# Patient Record
Sex: Female | Born: 1955 | Race: Black or African American | Hispanic: No | Marital: Single | Smoking: Former smoker
Health system: Southern US, Community
[De-identification: ages and names within clinical notes are randomized; demographics above are authoritative.]

## PROBLEM LIST (undated history)

## (undated) DIAGNOSIS — Z1509 Genetic susceptibility to other malignant neoplasm: Secondary | ICD-10-CM

## (undated) DIAGNOSIS — I10 Essential (primary) hypertension: Secondary | ICD-10-CM

## (undated) DIAGNOSIS — D352 Benign neoplasm of pituitary gland: Secondary | ICD-10-CM

## (undated) DIAGNOSIS — C539 Malignant neoplasm of cervix uteri, unspecified: Secondary | ICD-10-CM

## (undated) DIAGNOSIS — N809 Endometriosis, unspecified: Secondary | ICD-10-CM

## (undated) DIAGNOSIS — Z1501 Genetic susceptibility to malignant neoplasm of breast: Secondary | ICD-10-CM

## (undated) HISTORY — DX: Malignant neoplasm of cervix uteri, unspecified: C53.9

## (undated) HISTORY — PX: ANKLE SURGERY: SHX546

## (undated) HISTORY — DX: Genetic susceptibility to malignant neoplasm of breast: Z15.09

## (undated) HISTORY — PX: LIPOMA EXCISION: SHX5283

## (undated) HISTORY — DX: Endometriosis, unspecified: N80.9

## (undated) HISTORY — DX: Genetic susceptibility to malignant neoplasm of breast: Z15.01

## (undated) HISTORY — DX: Essential (primary) hypertension: I10

## (undated) HISTORY — DX: Benign neoplasm of pituitary gland: D35.2

## (undated) HISTORY — PX: LEG SURGERY: SHX1003

---

## 1995-11-23 HISTORY — PX: VAGINAL HYSTERECTOMY: SHX2639

## 1998-07-25 ENCOUNTER — Other Ambulatory Visit: Admission: RE | Admit: 1998-07-25 | Discharge: 1998-07-25 | Payer: Self-pay | Admitting: *Deleted

## 2002-01-25 ENCOUNTER — Encounter: Admission: RE | Admit: 2002-01-25 | Discharge: 2002-01-25 | Payer: Self-pay | Admitting: *Deleted

## 2002-01-25 ENCOUNTER — Other Ambulatory Visit: Admission: RE | Admit: 2002-01-25 | Discharge: 2002-01-25 | Payer: Self-pay | Admitting: *Deleted

## 2003-07-30 ENCOUNTER — Encounter: Admission: RE | Admit: 2003-07-30 | Discharge: 2003-07-30 | Payer: Self-pay | Admitting: *Deleted

## 2003-07-30 ENCOUNTER — Other Ambulatory Visit: Admission: RE | Admit: 2003-07-30 | Discharge: 2003-07-30 | Payer: Self-pay | Admitting: *Deleted

## 2004-09-18 ENCOUNTER — Encounter: Admission: RE | Admit: 2004-09-18 | Discharge: 2004-09-18 | Payer: Self-pay | Admitting: *Deleted

## 2008-11-22 HISTORY — PX: BREAST BIOPSY: SHX20

## 2009-08-15 ENCOUNTER — Encounter (INDEPENDENT_AMBULATORY_CARE_PROVIDER_SITE_OTHER): Payer: Self-pay | Admitting: Obstetrics and Gynecology

## 2009-08-15 ENCOUNTER — Encounter: Admission: RE | Admit: 2009-08-15 | Discharge: 2009-08-15 | Payer: Self-pay | Admitting: Obstetrics and Gynecology

## 2012-05-30 ENCOUNTER — Other Ambulatory Visit: Payer: Self-pay | Admitting: Obstetrics and Gynecology

## 2012-05-30 DIAGNOSIS — Z1231 Encounter for screening mammogram for malignant neoplasm of breast: Secondary | ICD-10-CM

## 2012-06-22 ENCOUNTER — Ambulatory Visit
Admission: RE | Admit: 2012-06-22 | Discharge: 2012-06-22 | Disposition: A | Discharge status: Home / Self Care | Payer: Commercial Indemnity | Source: Ambulatory Visit | Attending: Obstetrics and Gynecology | Admitting: Obstetrics and Gynecology

## 2012-06-22 DIAGNOSIS — Z1231 Encounter for screening mammogram for malignant neoplasm of breast: Secondary | ICD-10-CM

## 2013-06-12 ENCOUNTER — Other Ambulatory Visit: Payer: Self-pay

## 2013-06-12 DIAGNOSIS — Z1231 Encounter for screening mammogram for malignant neoplasm of breast: Secondary | ICD-10-CM

## 2013-07-16 ENCOUNTER — Ambulatory Visit
Admission: RE | Admit: 2013-07-16 | Discharge: 2013-07-16 | Disposition: A | Discharge status: Home / Self Care | Payer: Commercial Indemnity | Source: Ambulatory Visit

## 2013-07-16 DIAGNOSIS — Z1231 Encounter for screening mammogram for malignant neoplasm of breast: Secondary | ICD-10-CM

## 2013-07-30 ENCOUNTER — Other Ambulatory Visit: Payer: Self-pay | Admitting: Obstetrics and Gynecology

## 2013-07-30 ENCOUNTER — Other Ambulatory Visit: Payer: Self-pay | Admitting: Obstetrics

## 2013-07-30 DIAGNOSIS — R928 Other abnormal and inconclusive findings on diagnostic imaging of breast: Secondary | ICD-10-CM

## 2013-08-13 ENCOUNTER — Ambulatory Visit
Admission: RE | Admit: 2013-08-13 | Discharge: 2013-08-13 | Disposition: A | Discharge status: Home / Self Care | Payer: Commercial Indemnity | Source: Ambulatory Visit | Attending: Obstetrics and Gynecology | Admitting: Obstetrics and Gynecology

## 2013-08-13 DIAGNOSIS — R928 Other abnormal and inconclusive findings on diagnostic imaging of breast: Secondary | ICD-10-CM

## 2014-07-16 ENCOUNTER — Other Ambulatory Visit: Payer: Self-pay

## 2014-07-16 DIAGNOSIS — Z1231 Encounter for screening mammogram for malignant neoplasm of breast: Secondary | ICD-10-CM

## 2014-07-19 ENCOUNTER — Ambulatory Visit
Admission: RE | Admit: 2014-07-19 | Discharge: 2014-07-19 | Disposition: A | Discharge status: Home / Self Care | Payer: BC Managed Care – PPO | Source: Ambulatory Visit

## 2014-07-19 DIAGNOSIS — Z1231 Encounter for screening mammogram for malignant neoplasm of breast: Secondary | ICD-10-CM

## 2021-08-10 ENCOUNTER — Other Ambulatory Visit: Payer: Self-pay | Admitting: Obstetrics

## 2021-08-10 DIAGNOSIS — Z1231 Encounter for screening mammogram for malignant neoplasm of breast: Secondary | ICD-10-CM

## 2021-09-07 ENCOUNTER — Ambulatory Visit: Payer: Self-pay

## 2021-09-16 ENCOUNTER — Other Ambulatory Visit: Payer: Self-pay

## 2021-09-16 ENCOUNTER — Ambulatory Visit
Admission: RE | Admit: 2021-09-16 | Discharge: 2021-09-16 | Disposition: A | Discharge status: Home / Self Care | Payer: Managed Care, Other (non HMO) | Source: Ambulatory Visit | Attending: Obstetrics | Admitting: Obstetrics

## 2021-09-16 DIAGNOSIS — Z1231 Encounter for screening mammogram for malignant neoplasm of breast: Secondary | ICD-10-CM

## 2021-10-19 ENCOUNTER — Other Ambulatory Visit: Payer: Self-pay | Admitting: Obstetrics

## 2021-10-19 DIAGNOSIS — Z1501 Genetic susceptibility to malignant neoplasm of breast: Secondary | ICD-10-CM

## 2021-12-10 ENCOUNTER — Telehealth: Payer: Self-pay | Admitting: Hematology and Oncology

## 2021-12-10 NOTE — Telephone Encounter (Signed)
Scheduled appt per 1/19 referral. Spoke to pt who is aware of appt date and time. Pt had requested to schedule at the end of March. Will mail calendar and letter to pt per request.

## 2021-12-11 ENCOUNTER — Other Ambulatory Visit: Payer: Self-pay | Admitting: Obstetrics

## 2021-12-11 DIAGNOSIS — Z1501 Genetic susceptibility to malignant neoplasm of breast: Secondary | ICD-10-CM

## 2021-12-11 DIAGNOSIS — Z1509 Genetic susceptibility to other malignant neoplasm: Secondary | ICD-10-CM

## 2021-12-14 ENCOUNTER — Telehealth: Payer: Self-pay | Admitting: *Deleted

## 2021-12-14 NOTE — Telephone Encounter (Signed)
Attempted to reach the patient on 1/20 at 12:30 pm and LMOM to call the office back to a new patient appt with Dr Berline Lopes.  Attempted to reacht he patent today to schedule a new patient appt with Dr Berline Lopes [facility] to call the office back

## 2021-12-16 NOTE — Telephone Encounter (Signed)
Attempted to reacht he patent today to schedule a new patient appt with Dr Berline Lopes [facility] to call the office back

## 2021-12-23 ENCOUNTER — Telehealth: Payer: Self-pay | Admitting: *Deleted

## 2021-12-23 NOTE — Telephone Encounter (Signed)
LMOM for patient to call the office back   Called and spoke with Freda Munro at Dr [facility] office and explained that our office has reached out to the patient times 3 with no response

## 2021-12-24 ENCOUNTER — Telehealth: Payer: Self-pay | Admitting: *Deleted

## 2021-12-24 NOTE — Telephone Encounter (Signed)
Kathy Christensen from [facility] office call and stated that the patient does want to see Dr Berline Lopes. Patient requesting appt on 3/31. Called the patient and LMOM to call the office back

## 2021-12-24 NOTE — Telephone Encounter (Signed)
Patient called back ans was scheduled for a new patient appt for 3/31 at 9:45 am with Dr Berline Lopes per patient request. Patient given address and phone number for the clinic; along with the policy for mask and visitors

## 2022-02-18 ENCOUNTER — Encounter: Payer: Self-pay | Admitting: Gynecologic Oncology

## 2022-02-18 NOTE — Progress Notes (Signed)
GYNECOLOGIC ONCOLOGY NEW PATIENT CONSULTATION  ? ?Patient Name: Kathy Christensen  ?Patient Age: 66 y.o. ?Date of Service: 02/19/22 ?Referring Provider: [name], MD ?[address],  [place] [address]  ? ?Primary Care Provider: [name], MD ?Consulting Provider: Jeral Pinch, MD  ? ?Assessment/Plan:  ?Postmenopausal patient with pathogenic BRCA2 mutation. ? ?The risk of ovarian cancer in patients with BRCA 2 mutations is approximately 10-15% to the age of 1.  In addition, patients with BRCA mutations are also at increased risk of breast cancer, and an increased (but still low) risk of pancreatic cancer and melanoma.  Unlike their BRCA 1 mutation counterparts, patients with a BRCA2 mutation are not at increased risk of uterine serous carcinoma. ? ?The [employer]  (NCCN) recommends removal of bilateral ovaries and fallopian tubes between the ages of 60-40, and/or when childbearing is completed, to reduce the risk of ovarian and fallopian tube cancer.  If a patient does not undergo risk-reducing surgery, it is recommended they have CA-125 serum levels and pelvic ultrasounds every 6 months as a surveillance approach, although these are not particularly sensitive methods of screening. ? ?Preventive surgery to remove the ovaries and fallopian tubes reduces the risk of a related cancer by 80% in women who carry a BRCA1 or BRCA2 mutation.  Women who undergo preventive surgery retain approximately a 4% risk of developing cancer of the peritoneum.  ? ?We discussed her particular case in detail.  Given her age as well as the fact that I presume her fallopian tubes removed at the time of her hysterectomy for cervical cancer, I think her risk of developing ovarian cancer is likely lower than the numbers that I would "someone typically with a BRCA2 mutation.  The patient is going to look over her medical records to see if any of them state whether she had bilateral salpingectomy  at the time of her hysterectomy.   ? ?While she has had major abdominal surgery, this was performed laparoscopically and vaginally.  She did not require any adjuvant treatment with radiation.  While she may have some adhesive disease, this hopefully means that she is not at significant risk of having developed intra-abdominal and pelvic adhesions.  With her ovaries have not been pexed out of her pelvis, I think that pelvic exam and pelvic ultrasound have limited utility in being able to see and assess her ovaries.  If we were to follow her with imaging, it would likely need to be abdominal imaging with either CT scan or MRI.  She is very aware of the fact that we do not have good screening tools for ovarian cancer.   ? ?After discussion, the patient voiced that she would like to move forward with scheduling risk-reducing BSO. ? ?The patient lives alone and does not have much social support.  She lives more than 3 hours away.  She would plan to stay in town for several days at a hotel before driving home.   ? ?We discussed the plan for a robotic assisted bilateral salpingo-oophorectomy, possible staging, possible laparotomy.  I stressed that there is a low but present risk of finding cancer at the time of surgery for risk-reducing surgery in the setting of a BRCA mutation.  If that were to be the case, then I would proceed with additional staging procedures as indicated such as lymph node biopsy, peritoneal biopsy, and omentectomy.  The risks of surgery were discussed in detail and she understands these to include infection; wound separation; hernia;  injury to adjacent organs such as bowel, bladder, blood vessels, ureters and nerves; bleeding which may require blood transfusion; anesthesia risk; thromboembolic events; possible death; unforeseen complications; possible need for re-exploration; medical complications such as heart attack, stroke, pleural effusion and pneumonia; and, if full lymphadenectomy is performed  the risk of lymphedema and lymphocyst. The patient will receive DVT and antibiotic prophylaxis as indicated. She voiced a clear understanding. She had the opportunity to ask questions. Perioperative instructions were reviewed with her. Prescriptions for post-op medications were sent to her pharmacy of choice. ? ?A copy of this note was sent to the patient's referring provider.  ? ?70 minutes of total time was spent for this patient encounter, including preparation, face-to-face counseling with the patient and coordination of care, and documentation of the encounter. ? ? ?Jeral Pinch, MD  ?Division of Gynecologic Oncology  ?Department of Obstetrics and Gynecology  ?[facility]  ?___________________________________________  ?Chief Complaint: ?Chief Complaint  ?Patient presents with  ? Genetic susceptibility to malignant neoplasm of breast  ? ? ?History of Present Illness:  ?Kathy Christensen is a 66 y.o. y.o. female who is seen in consultation at the request of [name], MD for an evaluation of risk reducing strategies in the setting of a known BRCA2 mutation. ? ?Patient underwent genetic testing with [facility] in 09/2021.  Family history is notable for ovarian cancer in her mother and multiple maternal relatives with history of breast cancer.  Testing revealed a pathogenic mutation in BRCA2, [id number].  She was also found to have a VUS in [place], [id number]. Pelvic ultrasound exam performed at [facility] on 12/09/2021 was unable to evaluate bilateral adnexa due to shadowing from bowel. ? ?Patient reports doing well.  She denies any pelvic or abdominal pain.  She endorses normal bowel and bladder function.  She denies any postmenopausal bleeding or discharge.  She endorses a good appetite without nausea or emesis. ? 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  In discussing this with the patient today.  She  has surgery with an oncologist in [facility] that she describes as a vaginal hysterectomy with laparoscopic lymph node evaluation.  It sounds like she had a radical hysterectomy as she required a Foley catheter postoperatively.  She is unsure about her fallopian tubes, but was told that her ovaries were left in situ at the time of surgery and pexed out of her pelvis.  This was given concerned that she may need radiation although she did not require any adjuvant treatment.  During the time that she was treated for and followed after for her cervix cancer, she saw various oncologists and GYNs in [place], at [place] and women's in [place], in [place], and ultimately someone here in [place].  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 ? ?Patient is meeting with Dr. Chryl Heck today given her BRCA2 mutation. ? ?PAST MEDICAL HISTORY:  ?Past Medical History:  ?Diagnosis Date  ? BRCA2 gene mutation positive in female   ? Cervix cancer ([place])   ? Hypertension   ? Pituitary macroadenoma ([place])   ?  ? ?PAST SURGICAL HISTORY:  ?Past Surgical History:  ?Procedure Laterality Date  ? ANKLE SURGERY    ? BREAST BIOPSY Left 2010  ? LEG SURGERY Right   ? plate and pins  ? LIPOMA EXCISION    ? back of shoulder  ? VAGINAL HYSTERECTOMY  1997  ?  lsc LND for early cervix cancer  ? ? ?OB/GYN HISTORY:  ?OB History  ?Gravida Para Term Preterm AB Living  ?2 0       0  ?SAB IAB Ectopic Multiple Live Births  ?           ?  ?# Outcome Date GA Lbr Len/2nd Weight Sex Delivery Anes PTL Lv  ?2 Gravida           ?1 Gravida           ? ? ?No LMP recorded. Patient has had a hysterectomy. ? ?Age at menarche: 76 ?Age at menopause: Menopausal symptoms began in her 43s ?Hx of HRT: Denies ?Hx of STDs: Denies ?Last pap: 2022 ?History of abnormal pap smears: Yes, history of early stage cervix cancer in her 14s, normal Pap smears since ? ?SCREENING STUDIES:  ?Last mammogram: 08/2021, scheduled for MR today  ?Last colonoscopy: 11/2008 ?Last bone  mineral density: 2010 ? ?MEDICATIONS: ?Outpatient Encounter Medications as of 02/19/2022  ?Medication Sig  ? hydrochlorothiazide (HYDRODIURIL) 12.5 MG tablet Take 12.5 mg by mouth daily.  ? ?No facility-adm

## 2022-02-19 ENCOUNTER — Inpatient Hospital Stay (HOSPITAL_BASED_OUTPATIENT_CLINIC_OR_DEPARTMENT_OTHER): Payer: Managed Care, Other (non HMO) | Admitting: Gynecologic Oncology

## 2022-02-19 ENCOUNTER — Encounter: Payer: Self-pay | Admitting: Gynecologic Oncology

## 2022-02-19 ENCOUNTER — Encounter: Payer: Self-pay | Admitting: Hematology and Oncology

## 2022-02-19 ENCOUNTER — Inpatient Hospital Stay: Payer: Managed Care, Other (non HMO) | Attending: Hematology and Oncology | Admitting: Hematology and Oncology

## 2022-02-19 ENCOUNTER — Telehealth: Payer: Self-pay | Admitting: *Deleted

## 2022-02-19 ENCOUNTER — Ambulatory Visit
Admission: RE | Admit: 2022-02-19 | Discharge: 2022-02-19 | Disposition: A | Discharge status: Home / Self Care | Payer: Managed Care, Other (non HMO) | Source: Ambulatory Visit | Attending: Obstetrics | Admitting: Obstetrics

## 2022-02-19 ENCOUNTER — Other Ambulatory Visit: Payer: Self-pay

## 2022-02-19 ENCOUNTER — Inpatient Hospital Stay: Payer: Managed Care, Other (non HMO)

## 2022-02-19 VITALS — BP 119/67 | HR 64 | Temp 98.2°F | Resp 16 | Ht 64.0 in | Wt 244.5 lb

## 2022-02-19 DIAGNOSIS — Z8541 Personal history of malignant neoplasm of cervix uteri: Secondary | ICD-10-CM | POA: Diagnosis not present

## 2022-02-19 DIAGNOSIS — Z90710 Acquired absence of both cervix and uterus: Secondary | ICD-10-CM | POA: Diagnosis not present

## 2022-02-19 DIAGNOSIS — Z8041 Family history of malignant neoplasm of ovary: Secondary | ICD-10-CM | POA: Diagnosis not present

## 2022-02-19 DIAGNOSIS — Z1501 Genetic susceptibility to malignant neoplasm of breast: Secondary | ICD-10-CM

## 2022-02-19 DIAGNOSIS — Z148 Genetic carrier of other disease: Secondary | ICD-10-CM | POA: Diagnosis present

## 2022-02-19 DIAGNOSIS — Z803 Family history of malignant neoplasm of breast: Secondary | ICD-10-CM

## 2022-02-19 DIAGNOSIS — Z79899 Other long term (current) drug therapy: Secondary | ICD-10-CM | POA: Insufficient documentation

## 2022-02-19 DIAGNOSIS — Z1502 Genetic susceptibility to malignant neoplasm of ovary: Secondary | ICD-10-CM | POA: Diagnosis not present

## 2022-02-19 DIAGNOSIS — I10 Essential (primary) hypertension: Secondary | ICD-10-CM | POA: Insufficient documentation

## 2022-02-19 DIAGNOSIS — Z86018 Personal history of other benign neoplasm: Secondary | ICD-10-CM | POA: Diagnosis not present

## 2022-02-19 DIAGNOSIS — Z6841 Body Mass Index (BMI) 40.0 and over, adult: Secondary | ICD-10-CM | POA: Insufficient documentation

## 2022-02-19 MED ORDER — GADOBUTROL 1 MMOL/ML IV SOLN
10.0000 mL | Freq: Once | INTRAVENOUS | Status: AC | PRN
  Administered 2022-02-19: 10 mL via INTRAVENOUS

## 2022-02-19 NOTE — Telephone Encounter (Signed)
Spoke with pt this afternoon regarding staying overnight in the hospital after surgery to decrease her stress and maximize healing. Pt agree to plan. Providers informed. Pt verbalized understanding and did not have any other concerns at this time.  ?

## 2022-02-19 NOTE — Progress Notes (Signed)
Patient here for new patient consultation with Dr. Jeral Pinch and for a pre-operative discussion prior to her scheduled surgery on Apr 13, 2022. She is scheduled for robotic assisted laparoscopic bilateral salpingo-oophorectomy, possible staging if a cancer is found, possible laparotomy. The surgery was discussed in detail.  See after visit summary for additional details. Visual aids used to discuss items related to surgery including the sequential compression stockings, foley catheter, IV pump, multi-modal pain regimen including tylenol, photo of the surgical robot, female reproductive system to discuss surgery in detail.    ?  ?Discussed post-op pain management in detail including the aspects of the enhanced recovery pathway. We discussed the use of tylenol post-op and to monitor for a maximum of 4,000 mg in a 24 hour period. Discussed bowel regimen in detail.   ?  ?Discussed the use of SCDs, and measures to take at home to prevent DVT including frequent mobility.  Reportable signs and symptoms of DVT discussed. Post-operative instructions discussed and expectations for after surgery. Incisional care discussed as well including reportable signs and symptoms including erythema, drainage, wound separation.  ?   ?5 minutes spent with the patient.  Verbalizing understanding of material discussed. No needs or concerns voiced at the end of the visit.   Advised patient to call for any needs.  ? ?This appointment is included in the global surgical bundle as pre-operative teaching and has no charge.     ?

## 2022-02-19 NOTE — Progress Notes (Signed)
[facility] ?CONSULT NOTE ? ?Patient Care Team: ?[name], MD as PCP - General (Obstetrics and Gynecology) ? ?CHIEF COMPLAINTS/PURPOSE OF CONSULTATION:  ?BRCA 2 pathogenic mutation ? ?ASSESSMENT & PLAN:  ? ?This is a very pleasant 66 year old female patient with BRCA2 gene mutation, family history significant for breast and ovarian cancer followed by gynecology now referred to high risk breast clinic as well as gynecological oncology. ?We have discussed the following findings about BRCA2 gene mutation ? ?BRCA2 gene mutation: ?I discussed with the patient that BRCA2 is a tumor suppressor gene which helps repair damaged DNA or destroy cells if they cannot be repaired. In patients with BRCA1 or 2 mutations, the damaged DNA could not be repaired properly increasing the risk of cancers. BRCA2 gene is located on long arm of chromosome 13. These mutations are inherited in autosomal dominant fashion and hence 50% probability that their children may have a BRCA mutation. ? ?Cancer risk:  ?Average to risk of cancer by age of 62: (Antoniou et al pooled pedigree data from 47 studies of 8139 index patients with breast or ovarian cancer) ?Breast cancer risk: 45 percent (95% CI, 33 to 54 percent) ?Ovarian cancer risk: 11 percent (95% CI, 4.1 to 18 percent) ? ?[place] study: Tumor limited to risk by age of 50: ?Breast cancer risk: 55 percent (95% CI, 41 to 70 percent) ?Ovarian cancer risk: 16.5 percent (95% CI, 7.5 to 34 percent) ? ?I discussed the difference between BRCA1 and BRCA2 wherein patients BRCA1 patients have early onset disease and much higher risk of breast cancer. The mean age of diagnosis of breast cancer between BRCA1 and 2 are 38 versus 62 years, risk of ovarian cancer is also higher with BRCA1 but the overall risk under the age of 14 is very low. ? ?Other cancer risks:  ?1. Pancreatic cancer (relative risk is 3.51) with incidence of 4.9% in BRCA2 carriers ?2. Fallopian tube carcinoma and primary  peritoneal carcinoma ?3. Uterine papillary serous carcinoma: Overall risk is very low ?4. Colorectal cancers: In many studies there was no increased risk But there may be some for BRCA1 carriers ?5. Melanoma and other skin cancers: The risk of oatmeal melanoma is increase in BRCA2 mutation carriers but it still extremely rare. ?6. Endometrial cancer: Not very clear in terms of risks it is thought to be very low ? ?Breast cancer risk reduction/surveillance: ?1. Annual mammogram and breast MRI are recommended by NCCN starting at age of 11-30 ?2. Chemoprevention with tamoxifen-like agents is not entirely clear. Based on NSABP P1 study in the small cohort of patients with BRCA1 mutations, tamoxifen to reduce breast cancer risk by 62% and BRCA2 carriers but not in BRCA1 carriers. The study is limited because of small numbers.  ?3. After childbearing, evaluation for prophylactic bilateral mastectomy is an option. ?4. Oophorectomy self reduces the risk of breast cancer by 50% ?5. Breast self-examinations starting at age 77  ? ?Ovarian cancer risk reduction: ?1. Risk reducing bilateral salpingo-oophorectomy between the ages of 79-40 once childbearing is complete is recommended. In one study that was 72% reduction of risk of ovarian cancer and a decrease in breast cancer by approximately 50% ?2. There is no clear data for surveillance with CA125 or vaginal ultrasounds ?2. Oral contraception dose may be protective against ovarian cancer but it may slightly increase risk of breast cancer. ? ?Patient is not interested in proceeding with bilateral mastectomy.  She tells me that she has no support and no one that can  help her or assist her after surgery hence although she understands the risks, she is willing to do intensified screening but cannot proceed with surgery. ? ?She is agreeable to being followed up in the high-risk breast clinic every 6 months.  She has had MRI breast scheduled for later today.  She will be proceeding  with BSO in May by Dr. Berline Lopes. ? ?HISTORY OF PRESENTING ILLNESS:  ?Kathy Christensen 66 y.o. female is here because of BRCA 2 gene mutation. ? ?This is a 66 year old female patient with BRCA2 gene mutation referred to high risk breast clinic for additional recommendations.  She arrived to the appointment by herself.  She lives near [place].  She has already seen Dr. Berline Lopes and will be scheduled for bilateral salpingo-oophorectomy on May 23.  She is not ready to proceed with bilateral mastectomy.  She has her MRI of the breast scheduled for today.  She denies any breast changes.  She is nulliparous, states that she does not have the support system to recover from a bilateral mastectomy. ?She otherwise is healthy, works for a [employer]ch[employer], retail.  Rest of the pertinent 10 point ROS reviewed and negative. ? ?REVIEW OF SYSTEMS:   ?Constitutional: Denies fevers, chills or abnormal night sweats ?Eyes: Denies blurriness of vision, double vision or watery eyes ?Ears, nose, mouth, throat, and face: Denies mucositis or sore throat ?Respiratory: Denies cough, dyspnea or wheezes ?Cardiovascular: Denies palpitation, chest discomfort or lower extremity swelling ?Gastrointestinal:  Denies nausea, heartburn or change in bowel habits ?Skin: Denies abnormal skin rashes ?Lymphatics: Denies new lymphadenopathy or easy bruising ?Neurological:Denies numbness, tingling or new weaknesses ?Behavioral/Psych: Mood is stable, no new changes  ?All other systems were reviewed with the patient and are negative. ? ?MEDICAL HISTORY:  ?Past Medical History:  ?Diagnosis Date  ? BRCA2 gene mutation positive in female   ? Cervix cancer ([place])   ? Hypertension   ? Pituitary macroadenoma ([place])   ? ? ?SURGICAL HISTORY: ?Past Surgical History:  ?Procedure Laterality Date  ? ANKLE SURGERY    ? BREAST BIOPSY Left 2010  ? LEG SURGERY Right   ? plate and pins  ? LIPOMA EXCISION    ? back of shoulder  ? VAGINAL HYSTERECTOMY  1997  ? lsc LND for early cervix cancer  ? ? ?SOCIAL  HISTORY: ?Social History  ? ?Socioeconomic History  ? Marital status: Single  ?  Spouse name: Not on file  ? Number of children: Not on file  ? Years of education: Not on file  ? Highest education level: Not on file  ?Occupational History  ? Occupation: works for [employer]  ?Tobacco Use  ? Smoking status: Former  ?  Types: Cigarettes  ?  Quit date: 71  ?  Years since quitting: 30.2  ? Smokeless tobacco: Never  ?Substance and Sexual Activity  ? Alcohol use: Yes  ?  Alcohol/week: 3.0 standard drinks  ?  Types: 3 Standard drinks or equivalent per week  ? Drug use: Not Currently  ? Sexual activity: Not Currently  ?Other Topics Concern  ? Not on file  ?Social History Narrative  ? Not on file  ? ?Social Determinants of Health  ? ?Financial Resource Strain: Not on file  ?Food Insecurity: Not on file  ?Transportation Needs: Not on file  ?Physical Activity: Not on file  ?Stress: Not on file  ?Social Connections: Not on file  ?Intimate Partner Violence: Not on file  ? ? ?FAMILY HISTORY: ?Family History  ?Problem Relation Age of  Onset  ? Ovarian cancer Mother   ? Breast cancer Maternal Aunt   ? Breast cancer Maternal Grandmother   ? Prostate cancer Paternal Grandfather   ? ? ?ALLERGIES:  is allergic to penicillin g. ? ?MEDICATIONS:  ?Current Outpatient Medications  ?Medication Sig Dispense Refill  ? hydrochlorothiazide (HYDRODIURIL) 12.5 MG tablet Take 12.5 mg by mouth daily.    ? ?No current facility-administered medications for this visit.  ? ? ? ?PHYSICAL EXAMINATION: ?ECOG PERFORMANCE STATUS: 0 - Asymptomatic ? ?Vitals:  ? 02/19/22 1049  ?BP: 119/67  ?Temp: 98.2 ?F (36.8 ?C)  ?SpO2: 100%  ? ?Filed Weights  ? 02/19/22 1049  ?Weight: 244 lb (110.7 kg)  ? ? ?GENERAL:alert, no distress and comfortable ?SKIN: skin color, texture, turgor are normal, no rashes or significant lesions ?EYES: normal, conjunctiva are pink and non-injected, sclera clear ?OROPHARYNX:no exudate, no erythema and lips, buccal mucosa, and tongue normal   ?NECK: supple, thyroid normal size, non-tender, without nodularity ?LYMPH:  no palpable lymphadenopathy in the cervical, axillary or inguinal ?Breasts: Bilateral breast examined.  No palpable masses.  Some ab

## 2022-02-19 NOTE — Patient Instructions (Signed)
Surrounding hotel recommendations include [employer], [employer], [employer], [place], [place].  ? ?Preparing for your Surgery ? ?Plan for surgery on Apr 13, 2022 with Dr. Jeral Pinch at [place] will be scheduled for robotic assisted laparoscopic bilateral salpingo-oophorectomy (removal of the ovaries and fallopian tubes), possible staging if a cancer is found, possible laparotomy (larger incision on your abdomen if needed).  ? ?Pre-operative Testing ?-You will receive a phone call from presurgical testing at [facility] to arrange for a pre-operative appointment and lab work. ? ?-Bring your insurance card, copy of an advanced directive if applicable, medication list ? ?-At that visit, you will be asked to sign a consent for a possible blood transfusion in case a transfusion becomes necessary during surgery.  The need for a blood transfusion is rare but having consent is a necessary part of your care.    ? ?-You should not be taking blood thinners or aspirin at least ten days prior to surgery unless instructed by your surgeon. ? ?-Do not take supplements such as fish oil (omega 3), red yeast rice, turmeric before your surgery. You want to avoid medications with aspirin in them including headache powders such as BC or Goody's), Excedrin migraine. ? ?Day Before Surgery at Home ?-You will be asked to take in a light diet the day before surgery. You will be advised you can have clear liquids up until 3 hours before your surgery.   ? ?Eat a light diet the day before surgery.  Examples including soups, broths, toast, yogurt, mashed potatoes.  AVOID GAS PRODUCING FOODS. Things to avoid include carbonated beverages (fizzy beverages, sodas), raw fruits and raw vegetables (uncooked), or beans.  ? ?If your bowels are filled with gas, your surgeon will have difficulty visualizing your pelvic organs which increases your surgical risks. ? ?Your role in  recovery ?Your role is to become active as soon as directed by your doctor, while still giving yourself time to heal.  Rest when you feel tired. You will be asked to do the following in order to speed your recovery: ? ?- Cough and breathe deeply. This helps to clear and expand your lungs and can prevent pneumonia after surgery.  ?- STAY ACTIVE WHEN YOU GET HOME. Do mild physical activity. Walking or moving your legs help your circulation and body functions return to normal. Do not try to get up or walk alone the first time after surgery.   ?-If you develop swelling on one leg or the other, pain in the back of your leg, redness/warmth in one of your legs, please call the office or go to the Emergency Room to have a doppler to rule out a blood clot. For shortness of breath, chest pain-seek care in the Emergency Room as soon as possible. ?- Actively manage your pain. Managing your pain lets you move in comfort. We will ask you to rate your pain on a scale of zero to 10. It is your responsibility to tell your doctor or nurse where and how much you hurt so your pain can be treated. ? ?Special Considerations ?-If you are diabetic, you may be placed on insulin after surgery to have closer control over your blood sugars to promote healing and recovery.  This does not mean that you will be discharged on insulin.  If applicable, your oral antidiabetics will be resumed when you are tolerating a solid diet. ? ?-Your final pathology results from surgery should be available  around one week after surgery and the results will be relayed to you when available. ? ?-Dr. Lahoma Crocker is the surgeon that assists your GYN Oncologist with surgery.  If you end up staying the night, the next day after your surgery you will either see Dr. Berline Lopes or Dr. Lahoma Crocker. ? ?-FMLA forms can be faxed to [phone] and please allow 5-7 business days for completion. ? ?Pain Management After Surgery ?-You will be prescribed your pain  medication and bowel regimen medications before surgery so that you can have these available when you are discharged from the hospital. The pain medication is for use ONLY AFTER surgery and a new prescription will not be given.  ? ?-Make sure that you have Tylenol and Ibuprofen at home to use on a regular basis after surgery for pain control. We recommend alternating the medications every hour to six hours since they work differently and are processed in the body differently for pain relief. ? ?-Review the attached handout on narcotic use and their risks and side effects.  ? ?Bowel Regimen ?-You will be prescribed Sennakot-S to take nightly to prevent constipation especially if you are taking the narcotic pain medication intermittently.  It is important to prevent constipation and drink adequate amounts of liquids. You can stop taking this medication when you are not taking pain medication and you are back on your normal bowel routine. ? ?Risks of Surgery ?Risks of surgery are low but include bleeding, infection, damage to surrounding structures, re-operation, blood clots, and very rarely death. ? ? ?Blood Transfusion Information (For the consent to be signed before surgery) ? ?We will be checking your blood type before surgery so in case of emergencies, we will know what type of blood you would need. ? ?                                          WHAT IS A BLOOD TRANSFUSION? ? ?A transfusion is the replacement of blood or some of its parts. Blood is made up of multiple cells which provide different functions. ?Red blood cells carry oxygen and are used for blood loss replacement. ?White blood cells fight against infection. ?Platelets control bleeding. ?Plasma helps clot blood. ?Other blood products are available for specialized needs, such as hemophilia or other clotting disorders. ?BEFORE THE TRANSFUSION  ?Who gives blood for transfusions?  ?You may be able to donate blood to be used at a later date on yourself  (autologous donation). ?Relatives can be asked to donate blood. This is generally not any safer than if you have received blood from a stranger. The same precautions are taken to ensure safety when a relative's blood is donated. ?Healthy volunteers who are fully evaluated to make sure their blood is safe. This is blood bank blood. ?Transfusion therapy is the safest it has ever been in the practice of medicine. Before blood is taken from a donor, a complete history is taken to make sure that person has no history of diseases nor engages in risky social behavior (examples are intravenous drug use or sexual activity with multiple partners). The donor's travel history is screened to minimize risk of transmitting infections, such as malaria. The donated blood is tested for signs of infectious diseases, such as HIV and hepatitis. The blood is then tested to be sure it is compatible with you in order to minimize the chance of  a transfusion reaction. If you or a relative donates blood, this is often done in anticipation of surgery and is not appropriate for emergency situations. It takes many days to process the donated blood. ?RISKS AND COMPLICATIONS ?Although transfusion therapy is very safe and saves many lives, the main dangers of transfusion include:  ?Getting an infectious disease. ?Developing a transfusion reaction. This is an allergic reaction to something in the blood you were given. Every precaution is taken to prevent this. ?The decision to have a blood transfusion has been considered carefully by your caregiver before blood is given. Blood is not given unless the benefits outweigh the risks. ? ?AFTER SURGERY INSTRUCTIONS ? ?Return to work: 4-6 weeks if applicable ? ?Activity: ?1. Be up and out of the bed during the day.  Take a nap if needed.  You may walk up steps but be careful and use the hand rail.  Stair climbing will tire you more than you think, you may need to stop part way and rest.  ? ?2. No lifting or  straining for 6 weeks over 10 pounds. No pushing, pulling, straining for 6 weeks. ? ?3. No driving for around 1 week(s).  Do not drive if you are taking narcotic pain medicine and make sure that your reacti

## 2022-02-19 NOTE — Patient Instructions (Signed)
Preparing for your Surgery ?  ?Plan for surgery on Apr 13, 2022 with Dr. Jeral Pinch at [place] will be scheduled for robotic assisted laparoscopic bilateral salpingo-oophorectomy (removal of the ovaries and fallopian tubes), possible staging if a cancer is found, possible laparotomy (larger incision on your abdomen if needed).  ?  ?Pre-operative Testing ?-You will receive a phone call from presurgical testing at [facility] to arrange for a pre-operative appointment and lab work. ?  ?-Bring your insurance card, copy of an advanced directive if applicable, medication list ?  ?-At that visit, you will be asked to sign a consent for a possible blood transfusion in case a transfusion becomes necessary during surgery.  The need for a blood transfusion is rare but having consent is a necessary part of your care.    ?  ?-You should not be taking blood thinners or aspirin at least ten days prior to surgery unless instructed by your surgeon. ?  ?-Do not take supplements such as fish oil (omega 3), red yeast rice, turmeric before your surgery. You want to avoid medications with aspirin in them including headache powders such as BC or Goody's), Excedrin migraine. ?  ?Day Before Surgery at Home ?-You will be asked to take in a light diet the day before surgery. You will be advised you can have clear liquids up until 3 hours before your surgery.   ?  ?Eat a light diet the day before surgery.  Examples including soups, broths, toast, yogurt, mashed potatoes.  AVOID GAS PRODUCING FOODS. Things to avoid include carbonated beverages (fizzy beverages, sodas), raw fruits and raw vegetables (uncooked), or beans.  ?  ?If your bowels are filled with gas, your surgeon will have difficulty visualizing your pelvic organs which increases your surgical risks. ?  ?Your role in recovery ?Your role is to become active as soon as directed by your doctor, while still giving yourself time to heal.  Rest when you  feel tired. You will be asked to do the following in order to speed your recovery: ?  ?- Cough and breathe deeply. This helps to clear and expand your lungs and can prevent pneumonia after surgery.  ?- STAY ACTIVE WHEN YOU GET HOME. Do mild physical activity. Walking or moving your legs help your circulation and body functions return to normal. Do not try to get up or walk alone the first time after surgery.   ?-If you develop swelling on one leg or the other, pain in the back of your leg, redness/warmth in one of your legs, please call the office or go to the Emergency Room to have a doppler to rule out a blood clot. For shortness of breath, chest pain-seek care in the Emergency Room as soon as possible. ?- Actively manage your pain. Managing your pain lets you move in comfort. We will ask you to rate your pain on a scale of zero to 10. It is your responsibility to tell your doctor or nurse where and how much you hurt so your pain can be treated. ?  ?Special Considerations ?-If you are diabetic, you may be placed on insulin after surgery to have closer control over your blood sugars to promote healing and recovery.  This does not mean that you will be discharged on insulin.  If applicable, your oral antidiabetics will be resumed when you are tolerating a solid diet. ?  ?-Your final pathology results from surgery should be available around one week after surgery  and the results will be relayed to you when available. ?  ?-Dr. Lahoma Crocker is the surgeon that assists your GYN Oncologist with surgery.  If you end up staying the night, the next day after your surgery you will either see Dr. Berline Lopes or Dr. Lahoma Crocker. ?  ?-FMLA forms can be faxed to [phone] and please allow 5-7 business days for completion. ?  ?Pain Management After Surgery ?-You will be prescribed your pain medication and bowel regimen medications before surgery so that you can have these available when you are discharged from the  hospital. The pain medication is for use ONLY AFTER surgery and a new prescription will not be given.  ?  ?-Make sure that you have Tylenol and Ibuprofen at home to use on a regular basis after surgery for pain control. We recommend alternating the medications every hour to six hours since they work differently and are processed in the body differently for pain relief. ?  ?-Review the attached handout on narcotic use and their risks and side effects.  ?  ?Bowel Regimen ?-You will be prescribed Sennakot-S to take nightly to prevent constipation especially if you are taking the narcotic pain medication intermittently.  It is important to prevent constipation and drink adequate amounts of liquids. You can stop taking this medication when you are not taking pain medication and you are back on your normal bowel routine. ?  ?Risks of Surgery ?Risks of surgery are low but include bleeding, infection, damage to surrounding structures, re-operation, blood clots, and very rarely death. ?  ?  ?Blood Transfusion Information (For the consent to be signed before surgery) ?  ?We will be checking your blood type before surgery so in case of emergencies, we will know what type of blood you would need. ?  ?                                          WHAT IS A BLOOD TRANSFUSION? ?  ?A transfusion is the replacement of blood or some of its parts. Blood is made up of multiple cells which provide different functions. ?Red blood cells carry oxygen and are used for blood loss replacement. ?White blood cells fight against infection. ?Platelets control bleeding. ?Plasma helps clot blood. ?Other blood products are available for specialized needs, such as hemophilia or other clotting disorders. ?BEFORE THE TRANSFUSION  ?Who gives blood for transfusions?  ?You may be able to donate blood to be used at a later date on yourself (autologous donation). ?Relatives can be asked to donate blood. This is generally not any safer than if you have received  blood from a stranger. The same precautions are taken to ensure safety when a relative's blood is donated. ?Healthy volunteers who are fully evaluated to make sure their blood is safe. This is blood bank blood. ?Transfusion therapy is the safest it has ever been in the practice of medicine. Before blood is taken from a donor, a complete history is taken to make sure that person has no history of diseases nor engages in risky social behavior (examples are intravenous drug use or sexual activity with multiple partners). The donor's travel history is screened to minimize risk of transmitting infections, such as malaria. The donated blood is tested for signs of infectious diseases, such as HIV and hepatitis. The blood is then tested to be sure it is compatible with you  in order to minimize the chance of a transfusion reaction. If you or a relative donates blood, this is often done in anticipation of surgery and is not appropriate for emergency situations. It takes many days to process the donated blood. ?RISKS AND COMPLICATIONS ?Although transfusion therapy is very safe and saves many lives, the main dangers of transfusion include:  ?Getting an infectious disease. ?Developing a transfusion reaction. This is an allergic reaction to something in the blood you were given. Every precaution is taken to prevent this. ?The decision to have a blood transfusion has been considered carefully by your caregiver before blood is given. Blood is not given unless the benefits outweigh the risks. ?  ?AFTER SURGERY INSTRUCTIONS ?  ?Return to work: 4-6 weeks if applicable ?  ?Activity: ?1. Be up and out of the bed during the day.  Take a nap if needed.  You may walk up steps but be careful and use the hand rail.  Stair climbing will tire you more than you think, you may need to stop part way and rest.  ?  ?2. No lifting or straining for 6 weeks over 10 pounds. No pushing, pulling, straining for 6 weeks. ?  ?3. No driving for around 1  week(s).  Do not drive if you are taking narcotic pain medicine and make sure that your reaction time has returned.  ?  ?4. You can shower as soon as the next day after surgery. Shower daily.  Use your regular s

## 2022-02-23 ENCOUNTER — Other Ambulatory Visit: Payer: Self-pay | Admitting: Obstetrics

## 2022-02-23 DIAGNOSIS — R9389 Abnormal findings on diagnostic imaging of other specified body structures: Secondary | ICD-10-CM

## 2022-03-12 ENCOUNTER — Telehealth: Payer: Self-pay

## 2022-03-12 NOTE — Telephone Encounter (Signed)
Spoke with Kathy Christensen this morning and offered her an earlier surgical date of 03/17/22. Patient would prefer to keep her surgery as scheduled on 04/13/22 as she has already made accommodations around it.  ?

## 2022-04-01 NOTE — Patient Instructions (Addendum)
DUE TO COVID-19 ONLY TWO VISITORS  (aged 66 and older)  ARE ALLOWED TO COME WITH YOU AND STAY IN THE WAITING ROOM ONLY DURING PRE OP AND PROCEDURE.   ?**NO VISITORS ARE ALLOWED IN THE SHORT STAY AREA OR RECOVERY ROOM!!** ? ?IF YOU WILL BE ADMITTED INTO THE HOSPITAL YOU ARE ALLOWED ONLY FOUR SUPPORT PEOPLE DURING VISITATION HOURS ONLY (7 AM -8PM)   ?The support person(s) must pass our screening, gel in and out, and wear a mask at all times, including in the patient?s room. ?Patients must also wear a mask when staff or their support person are in the room. ?Visitors GUEST BADGE MUST BE WORN VISIBLY  ?One adult visitor may remain with you overnight and MUST be in the room by 8 P.M. ?  ? ? Your procedure is scheduled on: 04/13/22 ? ? Report to [facility] Main Entrance ? ?  Report to admitting at 5:15 AM ? ? Call this number if you have problems the morning of surgery [phone] ? ? Do not eat food :After Midnight. ? ? After Midnight you may have the following liquids until 4:30 AM DAY OF SURGERY ? ?Water ?Black Coffee (sugar ok, NO MILK/CREAM OR CREAMERS)  ?Tea (sugar ok, NO MILK/CREAM OR CREAMERS) regular and decaf                             ?Plain Jell-O (NO RED)                                           ?Fruit ices (not with fruit pulp, NO RED)                                     ?Popsicles (NO RED)                                                                  ?Juice: apple, WHITE grape, WHITE cranberry ?Sports drinks like Gatorade (NO RED) ?Clear broth(vegetable,chicken,beef) ?  ?The day of surgery:  ?Drink ONE (1) Pre-Surgery Clear Ensure at 4:30 AM the morning of surgery. Drink in one sitting. Do not sip.  ?This drink was given to you during your hospital  ?pre-op appointment visit. ?Nothing else to drink after completing the  ?Pre-Surgery Clear Ensure. ?  ?       If you have questions, please contact your surgeon?s office. ? ? ?FOLLOW BOWEL PREP AND ANY ADDITIONAL PRE OP INSTRUCTIONS YOU RECEIVED  FROM YOUR SURGEON'S OFFICE!!! ?  ?  ?Oral Hygiene is also important to reduce your risk of infection.                                    ?Remember - BRUSH YOUR TEETH THE MORNING OF SURGERY WITH YOUR REGULAR TOOTHPASTE ? ? Take these medicines the morning of surgery with A SIP OF WATER: None ?                  ?  You may not have any metal on your body including hair pins, jewelry, and body piercing ? ?           Do not wear make-up, lotions, powders, perfumes, or deodorant ? ?Do not wear nail polish including gel and S&S, artificial/acrylic nails, or any other type of covering on natural nails including finger and toenails. If you have artificial nails, gel coating, etc. that needs to be removed by a nail salon please have this removed prior to surgery or surgery may need to be canceled/ delayed if the surgeon/ anesthesia feels like they are unable to be safely monitored.  ? ?Do not shave  48 hours prior to surgery.  ? ? Do not bring valuables to the hospital. [facility] NOT ?            RESPONSIBLE   FOR VALUABLES. ? ? Bring small overnight bag day of surgery. ?  ?            Please read over the following fact sheets you were given: IF [place] [phone]- Apolonio Schneiders ? ?   [facility] - Preparing for Surgery ?Before surgery, you can play an important role.  Because skin is not sterile, your skin needs to be as free of germs as possible.  You can reduce the number of germs on your skin by washing with CHG (chlorahexidine gluconate) soap before surgery.  CHG is an antiseptic cleaner which kills germs and bonds with the skin to continue killing germs even after washing. ?Please DO NOT use if you have an allergy to CHG or antibacterial soaps.  If your skin becomes reddened/irritated stop using the CHG and inform your nurse when you arrive at Short Stay. ?Do not shave (including legs and underarms) for at least 48 hours prior to the first CHG shower.  You  may shave your face/neck. ? ?Please follow these instructions carefully: ? 1.  Shower with CHG Soap the night before surgery and the  morning of surgery. ? 2.  If you choose to wash your hair, wash your hair first as usual with your normal  shampoo. ? 3.  After you shampoo, rinse your hair and body thoroughly to remove the shampoo.                            ? 4.  Use CHG as you would any other liquid soap.  You can apply chg directly to the skin and wash.  Gently with a scrungie or clean washcloth. ? 5.  Apply the CHG Soap to your body ONLY FROM THE NECK DOWN.   Do   not use on face/ open      ?                     Wound or open sores. Avoid contact with eyes, ears mouth and   genitals (private parts).  ?                     Production manager,  Genitals (private parts) with your normal soap. ?            6.  Wash thoroughly, paying special attention to the area where your    surgery  will be performed. ? 7.  Thoroughly rinse your body with warm water from the neck down. ? 8.  DO NOT shower/wash with your  normal soap after using and rinsing off the CHG Soap. ?               9.  Pat yourself dry with a clean towel. ?           10.  Wear clean pajamas. ?           11.  Place clean sheets on your bed the night of your first shower and do not  sleep with pets. ?Day of Surgery : ?Do not apply any lotions/deodorants the morning of surgery.  Please wear clean clothes to the hospital/surgery center. ? ?FAILURE TO FOLLOW THESE INSTRUCTIONS MAY RESULT IN THE CANCELLATION OF YOUR SURGERY ? ?PATIENT SIGNATURE_________________________________ ? ?NURSE SIGNATURE__________________________________ ? ?________________________________________________________________________  ? ?Incentive Spirometer ? ?An incentive spirometer is a tool that can help keep your lungs clear and active. This tool measures how well you are filling your lungs with each breath. Taking long deep breaths may help reverse or decrease the chance of developing breathing  (pulmonary) problems (especially infection) following: ?A long period of time when you are unable to move or be active. ?BEFORE THE PROCEDURE  ?If the spirometer includes an indicator to show your best effort, your nurse or respiratory therapist will set it to a desired goal. ?If possible, sit up straight or lean slightly forward. Try not to slouch. ?Hold the incentive spirometer in an upright position. ?INSTRUCTIONS FOR USE  ?Sit on the edge of your bed if possible, or sit up as far as you can in bed or on a chair. ?Hold the incentive spirometer in an upright position. ?Breathe out normally. ?Place the mouthpiece in your mouth and seal your lips tightly around it. ?Breathe in slowly and as deeply as possible, raising the piston or the ball toward the top of the column. ?Hold your breath for 3-5 seconds or for as long as possible. Allow the piston or ball to fall to the bottom of the column. ?Remove the mouthpiece from your mouth and breathe out normally. ?Rest for a few seconds and repeat Steps 1 through 7 at least 10 times every 1-2 hours when you are awake. Take your time and take a few normal breaths between deep breaths. ?The spirometer may include an indicator to show your best effort. Use the indicator as a goal to work toward during each repetition. ?After each set of 10 deep breaths, practice coughing to be sure your lungs are clear. If you have an incision (the cut made at the time of surgery), support your incision when coughing by placing a pillow or rolled up towels firmly against it. ?Once you are able to get out of bed, walk around indoors and cough well. You may stop using the incentive spirometer when instructed by your caregiver.  ?RISKS AND COMPLICATIONS ?Take your time so you do not get dizzy or light-headed. ?If you are in pain, you may need to take or ask for pain medication before doing incentive spirometry. It is harder to take a deep breath if you are having pain. ?AFTER USE ?Rest and  breathe slowly and easily. ?It can be helpful to keep track of a log of your progress. Your caregiver can provide you with a simple table to help with this. ?If you are using the spirometer at home, follow t

## 2022-04-01 NOTE — Progress Notes (Addendum)
COVID Vaccine Completed: yes x3 ? ?Date of COVID positive in last 90 days: no ? ?PCP - [name], MD ?Cardiologist - n/a ? ?Chest x-ray - Sep 22 ?EKG - Sept 22 ?Stress Test - n/a ?ECHO - n/a ?Cardiac Cath - n/a ?Pacemaker/ICD device last checked: n/a ?Spinal Cord Stimulator: n/a ? ?Bowel Prep - light diet the day before surgery ? ?Sleep Study - n/a ?CPAP -  ? ?Fasting Blood Sugar - n/a ?Checks Blood Sugar _____ times a day ? ?Blood Thinner Instructions: n/a ?Aspirin Instructions: ?Last Dose: ? ?Activity level: Can go up a flight of stairs and perform activities of daily living without stopping and without symptoms of chest pain or shortness of breath. ?     ? ?Anesthesia review:  ? ?Patient denies shortness of breath, fever, cough and chest pain at PAT appointment ? ? ?Patient verbalized understanding of instructions that were given to them at the PAT appointment. Patient was also instructed that they will need to review over the PAT instructions again at home before surgery.  ?

## 2022-04-02 ENCOUNTER — Encounter (HOSPITAL_COMMUNITY)
Admission: RE | Admit: 2022-04-02 | Discharge: 2022-04-02 | Disposition: A | Discharge status: Home / Self Care | Payer: Managed Care, Other (non HMO) | Source: Ambulatory Visit | Attending: Gynecologic Oncology | Admitting: Gynecologic Oncology

## 2022-04-02 ENCOUNTER — Encounter (HOSPITAL_COMMUNITY): Payer: Self-pay

## 2022-04-02 VITALS — BP 133/87 | HR 82 | Temp 98.8°F | Resp 17 | Ht 63.0 in | Wt 244.0 lb

## 2022-04-02 DIAGNOSIS — I2510 Atherosclerotic heart disease of native coronary artery without angina pectoris: Secondary | ICD-10-CM

## 2022-04-02 DIAGNOSIS — Z1502 Genetic susceptibility to malignant neoplasm of ovary: Secondary | ICD-10-CM | POA: Insufficient documentation

## 2022-04-02 DIAGNOSIS — Z1501 Genetic susceptibility to malignant neoplasm of breast: Secondary | ICD-10-CM | POA: Diagnosis not present

## 2022-04-02 DIAGNOSIS — Z1509 Genetic susceptibility to other malignant neoplasm: Secondary | ICD-10-CM | POA: Diagnosis not present

## 2022-04-02 DIAGNOSIS — Z01812 Encounter for preprocedural laboratory examination: Secondary | ICD-10-CM | POA: Diagnosis present

## 2022-04-02 LAB — CBC
HCT: 40.7 % (ref 36.0–46.0)
Hemoglobin: 13.2 g/dL (ref 12.0–15.0)
MCH: 28.8 pg (ref 26.0–34.0)
MCHC: 32.4 g/dL (ref 30.0–36.0)
MCV: 88.9 fL (ref 80.0–100.0)
Platelets: 290 10*3/uL (ref 150–400)
RBC: 4.58 MIL/uL (ref 3.87–5.11)
RDW: 15 % (ref 11.5–15.5)
WBC: 7.8 10*3/uL (ref 4.0–10.5)
nRBC: 0 % (ref 0.0–0.2)

## 2022-04-02 LAB — COMPREHENSIVE METABOLIC PANEL
ALT: 15 U/L (ref 0–44)
AST: 16 U/L (ref 15–41)
Albumin: 3.9 g/dL (ref 3.5–5.0)
Alkaline Phosphatase: 63 U/L (ref 38–126)
Anion gap: 9 (ref 5–15)
BUN: 15 mg/dL (ref 8–23)
CO2: 24 mmol/L (ref 22–32)
Calcium: 9.5 mg/dL (ref 8.9–10.3)
Chloride: 107 mmol/L (ref 98–111)
Creatinine, Ser: 0.88 mg/dL (ref 0.44–1.00)
GFR, Estimated: >60 mL/min (ref >60)
Glucose, Bld: 97 mg/dL (ref 70–99)
Potassium: 3.9 mmol/L (ref 3.5–5.1)
Sodium: 140 mmol/L (ref 135–145)
Total Bilirubin: 0.7 mg/dL (ref 0.3–1.2)
Total Protein: 7.6 g/dL (ref 6.5–8.1)

## 2022-04-02 LAB — TYPE AND SCREEN
ABO/RH(D): O NEG
Antibody Screen: NEGATIVE

## 2022-04-09 ENCOUNTER — Telehealth: Payer: Self-pay | Admitting: *Deleted

## 2022-04-09 NOTE — Telephone Encounter (Signed)
Patient returned call and stated "I need 4-6 weeks off of work for recovery. 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 Also I possible have a friend coming in that I will pick up at the airport on my way to the surgery. That person will be able to stay with and I may not have to stay overnight."   Explained that the provider is not in the office, but the message would be given to them on Monday

## 2022-04-12 ENCOUNTER — Ambulatory Visit
Admission: RE | Admit: 2022-04-12 | Discharge: 2022-04-12 | Disposition: A | Discharge status: Home / Self Care | Payer: Managed Care, Other (non HMO) | Source: Ambulatory Visit | Attending: Obstetrics | Admitting: Obstetrics

## 2022-04-12 ENCOUNTER — Other Ambulatory Visit: Payer: Self-pay | Admitting: Gynecologic Oncology

## 2022-04-12 ENCOUNTER — Telehealth: Payer: Self-pay | Admitting: *Deleted

## 2022-04-12 DIAGNOSIS — R9389 Abnormal findings on diagnostic imaging of other specified body structures: Secondary | ICD-10-CM

## 2022-04-12 DIAGNOSIS — Z1501 Genetic susceptibility to malignant neoplasm of breast: Secondary | ICD-10-CM

## 2022-04-12 HISTORY — PX: BREAST BIOPSY: SHX20

## 2022-04-12 MED ORDER — SENNOSIDES-DOCUSATE SODIUM 8.6-50 MG PO TABS: 2.0000 | ORAL_TABLET | Freq: Every day | ORAL | 0 refills | Status: DC

## 2022-04-12 MED ORDER — TRAMADOL HCL 50 MG PO TABS: 50.0000 mg | ORAL_TABLET | Freq: Four times a day (QID) | ORAL | 0 refills | Status: DC | PRN

## 2022-04-12 MED ORDER — GADOBUTROL 1 MMOL/ML IV SOLN
10.0000 mL | Freq: Once | INTRAVENOUS | Status: AC | PRN
  Administered 2022-04-12: 10 mL via INTRAVENOUS

## 2022-04-12 NOTE — Telephone Encounter (Signed)
Attempted to review pre op information with pt. Unable to reach pt. LVM for return call.

## 2022-04-12 NOTE — Telephone Encounter (Signed)
Telephone call to check on pre-operative status.  Patient compliant with pre-operative instructions.  Reinforced nothing to eat after midnight. Clear liquids until 0430. Patient to arrive at 0515.  No questions or concerns voiced.  Instructed to call for any needs.  ?

## 2022-04-13 ENCOUNTER — Other Ambulatory Visit: Payer: Self-pay

## 2022-04-13 ENCOUNTER — Ambulatory Visit (HOSPITAL_COMMUNITY)
Admission: RE | Admit: 2022-04-13 | Discharge: 2022-04-14 | Disposition: A | Discharge status: Home / Self Care | Payer: Managed Care, Other (non HMO) | Attending: Gynecologic Oncology | Admitting: Gynecologic Oncology

## 2022-04-13 ENCOUNTER — Encounter (HOSPITAL_COMMUNITY)
Admission: RE | Disposition: A | Discharge status: Home / Self Care | Payer: Self-pay | Source: Home / Self Care | Attending: Gynecologic Oncology

## 2022-04-13 ENCOUNTER — Encounter (HOSPITAL_COMMUNITY): Payer: Self-pay | Admitting: Gynecologic Oncology

## 2022-04-13 ENCOUNTER — Ambulatory Visit (HOSPITAL_COMMUNITY): Payer: Managed Care, Other (non HMO) | Admitting: Certified Registered"

## 2022-04-13 ENCOUNTER — Ambulatory Visit (HOSPITAL_BASED_OUTPATIENT_CLINIC_OR_DEPARTMENT_OTHER): Payer: Managed Care, Other (non HMO) | Admitting: Certified Registered"

## 2022-04-13 DIAGNOSIS — N135 Crossing vessel and stricture of ureter without hydronephrosis: Secondary | ICD-10-CM | POA: Diagnosis not present

## 2022-04-13 DIAGNOSIS — Z1509 Genetic susceptibility to other malignant neoplasm: Secondary | ICD-10-CM | POA: Insufficient documentation

## 2022-04-13 DIAGNOSIS — Z8541 Personal history of malignant neoplasm of cervix uteri: Secondary | ICD-10-CM | POA: Insufficient documentation

## 2022-04-13 DIAGNOSIS — Z8041 Family history of malignant neoplasm of ovary: Secondary | ICD-10-CM | POA: Insufficient documentation

## 2022-04-13 DIAGNOSIS — Z90710 Acquired absence of both cervix and uterus: Secondary | ICD-10-CM | POA: Diagnosis not present

## 2022-04-13 DIAGNOSIS — Z1501 Genetic susceptibility to malignant neoplasm of breast: Secondary | ICD-10-CM | POA: Diagnosis present

## 2022-04-13 DIAGNOSIS — Z4002 Encounter for prophylactic removal of ovary: Secondary | ICD-10-CM

## 2022-04-13 DIAGNOSIS — Z1502 Genetic susceptibility to malignant neoplasm of ovary: Secondary | ICD-10-CM

## 2022-04-13 DIAGNOSIS — N838 Other noninflammatory disorders of ovary, fallopian tube and broad ligament: Secondary | ICD-10-CM | POA: Insufficient documentation

## 2022-04-13 HISTORY — PX: ROBOTIC ASSISTED BILATERAL SALPINGO OOPHERECTOMY: SHX6078

## 2022-04-13 LAB — ABO/RH: ABO/RH(D): O NEG

## 2022-04-13 SURGERY — SALPINGO-OOPHORECTOMY, BILATERAL, ROBOT-ASSISTED
Anesthesia: General | Site: Abdomen | Laterality: Bilateral

## 2022-04-13 MED ORDER — SUGAMMADEX SODIUM 200 MG/2ML IV SOLN
INTRAVENOUS | Status: DC | PRN
Start: 2022-04-13 — End: 2022-04-13
  Administered 2022-04-13: 400 mg via INTRAVENOUS

## 2022-04-13 MED ORDER — ONDANSETRON HCL 4 MG PO TABS: 4.0000 mg | ORAL_TABLET | Freq: Four times a day (QID) | ORAL | Status: DC | PRN

## 2022-04-13 MED ORDER — PROPOFOL 10 MG/ML IV BOLUS
INTRAVENOUS | Status: AC
  Filled 2022-04-13: qty 20

## 2022-04-13 MED ORDER — LIDOCAINE 2% (20 MG/ML) 5 ML SYRINGE
INTRAMUSCULAR | Status: DC | PRN
  Administered 2022-04-13: 1.5 mg/kg/h via INTRAVENOUS
  Administered 2022-04-13: 100 mg via INTRAVENOUS

## 2022-04-13 MED ORDER — OXYCODONE HCL 5 MG PO TABS
5.0000 mg | ORAL_TABLET | ORAL | Status: DC | PRN
  Administered 2022-04-13 (×2): 10 mg via ORAL
  Filled 2022-04-13 (×2): qty 2

## 2022-04-13 MED ORDER — DROPERIDOL 2.5 MG/ML IJ SOLN
INTRAMUSCULAR | Status: DC | PRN
  Administered 2022-04-13: .625 mg via INTRAVENOUS

## 2022-04-13 MED ORDER — HEPARIN SODIUM (PORCINE) 5000 UNIT/ML IJ SOLN
5000.0000 [IU] | INTRAMUSCULAR | Status: AC
  Administered 2022-04-13: 5000 [IU] via SUBCUTANEOUS
  Filled 2022-04-13: qty 1

## 2022-04-13 MED ORDER — HYDROMORPHONE HCL 1 MG/ML IJ SOLN: 0.2000 mg | INTRAMUSCULAR | Status: DC | PRN

## 2022-04-13 MED ORDER — LACTATED RINGERS IR SOLN
Status: DC | PRN
Start: 2022-04-13 — End: 2022-04-13
  Administered 2022-04-13: 1000 mL

## 2022-04-13 MED ORDER — ONDANSETRON HCL 4 MG/2ML IJ SOLN
INTRAMUSCULAR | Status: AC
  Filled 2022-04-13: qty 2

## 2022-04-13 MED ORDER — FENTANYL CITRATE PF 50 MCG/ML IJ SOSY: 25.0000 ug | PREFILLED_SYRINGE | INTRAMUSCULAR | Status: DC | PRN

## 2022-04-13 MED ORDER — KETOROLAC TROMETHAMINE 30 MG/ML IJ SOLN: 30.0000 mg | Freq: Once | INTRAMUSCULAR | Status: DC | PRN

## 2022-04-13 MED ORDER — HYDRALAZINE HCL 20 MG/ML IJ SOLN
INTRAMUSCULAR | Status: AC
  Filled 2022-04-13: qty 1

## 2022-04-13 MED ORDER — FENTANYL CITRATE (PF) 250 MCG/5ML IJ SOLN
INTRAMUSCULAR | Status: AC
  Filled 2022-04-13: qty 5

## 2022-04-13 MED ORDER — PHENYLEPHRINE HCL (PRESSORS) 10 MG/ML IV SOLN
INTRAVENOUS | Status: AC
  Filled 2022-04-13: qty 1

## 2022-04-13 MED ORDER — ONDANSETRON HCL 4 MG/2ML IJ SOLN: 4.0000 mg | Freq: Once | INTRAMUSCULAR | Status: DC | PRN

## 2022-04-13 MED ORDER — DEXAMETHASONE SODIUM PHOSPHATE 4 MG/ML IJ SOLN
4.0000 mg | INTRAMUSCULAR | Status: AC
  Administered 2022-04-13: 4 mg via INTRAVENOUS

## 2022-04-13 MED ORDER — ACETAMINOPHEN 500 MG PO TABS
1000.0000 mg | ORAL_TABLET | ORAL | Status: AC
  Administered 2022-04-13: 1000 mg via ORAL
  Filled 2022-04-13: qty 2

## 2022-04-13 MED ORDER — LACTATED RINGERS IV SOLN: INTRAVENOUS | Status: DC

## 2022-04-13 MED ORDER — ROCURONIUM BROMIDE 10 MG/ML (PF) SYRINGE
PREFILLED_SYRINGE | INTRAVENOUS | Status: AC
  Filled 2022-04-13: qty 10

## 2022-04-13 MED ORDER — TRAMADOL HCL 50 MG PO TABS: 50.0000 mg | ORAL_TABLET | Freq: Four times a day (QID) | ORAL | Status: DC | PRN

## 2022-04-13 MED ORDER — MIDAZOLAM HCL 5 MG/5ML IJ SOLN
INTRAMUSCULAR | Status: DC | PRN
Start: 2022-04-13 — End: 2022-04-13
  Administered 2022-04-13: 2 mg via INTRAVENOUS

## 2022-04-13 MED ORDER — HYDRALAZINE HCL 20 MG/ML IJ SOLN
10.0000 mg | Freq: Once | INTRAMUSCULAR | Status: AC
  Administered 2022-04-13: 10 mg via INTRAVENOUS

## 2022-04-13 MED ORDER — ROCURONIUM BROMIDE 10 MG/ML (PF) SYRINGE
PREFILLED_SYRINGE | INTRAVENOUS | Status: DC | PRN
  Administered 2022-04-13: 80 mg via INTRAVENOUS
  Administered 2022-04-13 (×2): 20 mg via INTRAVENOUS

## 2022-04-13 MED ORDER — ORAL CARE MOUTH RINSE: 15.0000 mL | Freq: Once | OROMUCOSAL | Status: AC

## 2022-04-13 MED ORDER — STERILE WATER FOR INJECTION IJ SOLN
INTRAMUSCULAR | Status: AC
  Filled 2022-04-13: qty 10

## 2022-04-13 MED ORDER — BUPIVACAINE HCL 0.25 % IJ SOLN
INTRAMUSCULAR | Status: DC | PRN
  Administered 2022-04-13: 30 mL

## 2022-04-13 MED ORDER — MIDAZOLAM HCL 2 MG/2ML IJ SOLN
INTRAMUSCULAR | Status: AC
Start: 2022-04-13 — End: ?
  Filled 2022-04-13: qty 2

## 2022-04-13 MED ORDER — DEXAMETHASONE SODIUM PHOSPHATE 10 MG/ML IJ SOLN
INTRAMUSCULAR | Status: AC
  Filled 2022-04-13: qty 1

## 2022-04-13 MED ORDER — SUCCINYLCHOLINE CHLORIDE 200 MG/10ML IV SOSY
PREFILLED_SYRINGE | INTRAVENOUS | Status: AC
  Filled 2022-04-13: qty 10

## 2022-04-13 MED ORDER — ONDANSETRON HCL 4 MG/2ML IJ SOLN
INTRAMUSCULAR | Status: DC | PRN
  Administered 2022-04-13: 4 mg via INTRAVENOUS

## 2022-04-13 MED ORDER — OXYCODONE HCL 5 MG PO TABS: 5.0000 mg | ORAL_TABLET | Freq: Once | ORAL | Status: DC | PRN

## 2022-04-13 MED ORDER — PHENYLEPHRINE 80 MCG/ML (10ML) SYRINGE FOR IV PUSH (FOR BLOOD PRESSURE SUPPORT)
PREFILLED_SYRINGE | INTRAVENOUS | Status: DC | PRN
  Administered 2022-04-13: 100 ug via INTRAVENOUS

## 2022-04-13 MED ORDER — CHLORHEXIDINE GLUCONATE 0.12 % MT SOLN
15.0000 mL | Freq: Once | OROMUCOSAL | Status: AC
  Administered 2022-04-13: 15 mL via OROMUCOSAL

## 2022-04-13 MED ORDER — STERILE WATER FOR IRRIGATION IR SOLN
Status: DC | PRN
  Administered 2022-04-13: 1000 mL

## 2022-04-13 MED ORDER — FENTANYL CITRATE (PF) 100 MCG/2ML IJ SOLN
INTRAMUSCULAR | Status: DC | PRN
  Administered 2022-04-13 (×3): 50 ug via INTRAVENOUS
  Administered 2022-04-13: 100 ug via INTRAVENOUS

## 2022-04-13 MED ORDER — BUPIVACAINE HCL 0.25 % IJ SOLN
INTRAMUSCULAR | Status: AC
  Filled 2022-04-13: qty 1

## 2022-04-13 MED ORDER — PROPOFOL 10 MG/ML IV BOLUS
INTRAVENOUS | Status: DC | PRN
  Administered 2022-04-13: 200 mg via INTRAVENOUS
  Administered 2022-04-13: 50 mg via INTRAVENOUS

## 2022-04-13 MED ORDER — STERILE WATER FOR INJECTION IJ SOLN
INTRAMUSCULAR | Status: DC | PRN
Start: 2022-04-13 — End: 2022-04-13

## 2022-04-13 MED ORDER — ONDANSETRON HCL 4 MG/2ML IJ SOLN: 4.0000 mg | Freq: Four times a day (QID) | INTRAMUSCULAR | Status: DC | PRN

## 2022-04-13 MED ORDER — OXYCODONE HCL 5 MG/5ML PO SOLN: 5.0000 mg | Freq: Once | ORAL | Status: DC | PRN

## 2022-04-13 MED ORDER — KETAMINE HCL 50 MG/5ML IJ SOSY
PREFILLED_SYRINGE | INTRAMUSCULAR | Status: AC
  Filled 2022-04-13: qty 5

## 2022-04-13 SURGICAL SUPPLY — 80 items
ADH SKN CLS APL DERMABOND .7 (GAUZE/BANDAGES/DRESSINGS) ×2
AGENT HMST KT MTR STRL THRMB (HEMOSTASIS) ×2
APL ESCP 34 STRL LF DISP (HEMOSTASIS) ×2
APPLICATOR SURGIFLO ENDO (HEMOSTASIS) ×2 IMPLANT
BACTOSHIELD CHG 4% 4OZ (MISCELLANEOUS) ×1
BAG LAPAROSCOPIC 12 15 PORT 16 (BASKET) IMPLANT
BAG RETRIEVAL 10 (BASKET) ×1
BAG RETRIEVAL 12/15 (BASKET)
BLADE SURG SZ10 CARB STEEL (BLADE) IMPLANT
COVER BACK TABLE 60X90IN (DRAPES) ×4 IMPLANT
COVER TIP SHEARS 8 DVNC (MISCELLANEOUS) ×3 IMPLANT
COVER TIP SHEARS 8MM DA VINCI (MISCELLANEOUS) ×3
DERMABOND ADVANCED (GAUZE/BANDAGES/DRESSINGS) ×1
DERMABOND ADVANCED .7 DNX12 (GAUZE/BANDAGES/DRESSINGS) ×3 IMPLANT
DRAPE ARM DVNC X/XI (DISPOSABLE) ×12 IMPLANT
DRAPE COLUMN DVNC XI (DISPOSABLE) ×3 IMPLANT
DRAPE DA VINCI XI ARM (DISPOSABLE) ×12
DRAPE DA VINCI XI COLUMN (DISPOSABLE) ×3
DRAPE SHEET LG 3/4 BI-LAMINATE (DRAPES) ×4 IMPLANT
DRAPE SURG IRRIG POUCH 19X23 (DRAPES) ×4 IMPLANT
DRSG OPSITE POSTOP 4X6 (GAUZE/BANDAGES/DRESSINGS) IMPLANT
DRSG OPSITE POSTOP 4X8 (GAUZE/BANDAGES/DRESSINGS) IMPLANT
ELECT PENCIL ROCKER SW 15FT (MISCELLANEOUS) IMPLANT
ELECT REM PT RETURN 15FT ADLT (MISCELLANEOUS) ×4 IMPLANT
GAUZE 4X4 16PLY ~~LOC~~+RFID DBL (SPONGE) ×4 IMPLANT
GLOVE BIO SURGEON STRL SZ 6 (GLOVE) ×16 IMPLANT
GLOVE BIO SURGEON STRL SZ 6.5 (GLOVE) ×8 IMPLANT
GOWN STRL REUS W/ TWL LRG LVL3 (GOWN DISPOSABLE) ×12 IMPLANT
GOWN STRL REUS W/TWL LRG LVL3 (GOWN DISPOSABLE) ×12
HOLDER FOLEY CATH W/STRAP (MISCELLANEOUS) IMPLANT
IRRIG SUCT STRYKERFLOW 2 WTIP (MISCELLANEOUS) ×3
IRRIGATION SUCT STRKRFLW 2 WTP (MISCELLANEOUS) ×3 IMPLANT
KIT PROCEDURE DA VINCI SI (MISCELLANEOUS)
KIT PROCEDURE DVNC SI (MISCELLANEOUS) IMPLANT
KIT TURNOVER KIT A (KITS) IMPLANT
LIGASURE IMPACT 36 18CM CVD LR (INSTRUMENTS) IMPLANT
MANIPULATOR ADVINCU DEL 3.0 PL (MISCELLANEOUS) IMPLANT
MANIPULATOR ADVINCU DEL 3.5 PL (MISCELLANEOUS) IMPLANT
MANIPULATOR UTERINE 4.5 ZUMI (MISCELLANEOUS) IMPLANT
NDL HYPO 21X1.5 SAFETY (NEEDLE) ×2 IMPLANT
NDL SPNL 18GX3.5 QUINCKE PK (NEEDLE) IMPLANT
NEEDLE HYPO 21X1.5 SAFETY (NEEDLE) ×3 IMPLANT
NEEDLE SPNL 18GX3.5 QUINCKE PK (NEEDLE) IMPLANT
OBTURATOR OPTICAL STANDARD 8MM (TROCAR) ×3
OBTURATOR OPTICAL STND 8 DVNC (TROCAR) ×2
OBTURATOR OPTICALSTD 8 DVNC (TROCAR) ×3 IMPLANT
PACK ROBOT GYN CUSTOM WL (TRAY / TRAY PROCEDURE) ×4 IMPLANT
PAD POSITIONING PINK XL (MISCELLANEOUS) ×4 IMPLANT
PORT ACCESS TROCAR AIRSEAL 12 (TROCAR) ×3 IMPLANT
PORT ACCESS TROCAR AIRSEAL 5M (TROCAR) ×1
SCRUB CHG 4% DYNA-HEX 4OZ (MISCELLANEOUS) ×3 IMPLANT
SEAL CANN UNIV 5-8 DVNC XI (MISCELLANEOUS) ×12 IMPLANT
SEAL XI 5MM-8MM UNIVERSAL (MISCELLANEOUS) ×12
SET IRRIG Y TYPE TUR BLADDER L (SET/KITS/TRAYS/PACK) IMPLANT
SET TRI-LUMEN FLTR TB AIRSEAL (TUBING) ×4 IMPLANT
SPIKE FLUID TRANSFER (MISCELLANEOUS) ×4 IMPLANT
SURGIFLO W/THROMBIN 8M KIT (HEMOSTASIS) ×2 IMPLANT
SUT MNCRL AB 4-0 PS2 18 (SUTURE) IMPLANT
SUT PDS AB 1 TP1 96 (SUTURE) IMPLANT
SUT VIC AB 0 CT1 27 (SUTURE)
SUT VIC AB 0 CT1 27XBRD ANTBC (SUTURE) IMPLANT
SUT VIC AB 2-0 CT1 27 (SUTURE)
SUT VIC AB 2-0 CT1 TAPERPNT 27 (SUTURE) IMPLANT
SUT VIC AB 4-0 PS2 18 (SUTURE) ×10 IMPLANT
SYR 10ML LL (SYRINGE) IMPLANT
SYR TOOMEY IRRIG 70ML (MISCELLANEOUS)
SYRINGE TOOMEY IRRIG 70ML (MISCELLANEOUS) IMPLANT
SYS BAG RETRIEVAL 10MM (BASKET) ×2
SYS RETRIEVAL 5MM INZII UNIV (BASKET) ×3
SYS WOUND ALEXIS 18CM MED (MISCELLANEOUS)
SYSTEM BAG RETRIEVAL 10MM (BASKET) ×1 IMPLANT
SYSTEM RETRIEVL 5MM INZII UNIV (BASKET) ×1 IMPLANT
SYSTEM WOUND ALEXIS 18CM MED (MISCELLANEOUS) IMPLANT
TOWEL OR NON WOVEN STRL DISP B (DISPOSABLE) IMPLANT
TRAP SPECIMEN MUCUS 40CC (MISCELLANEOUS) ×2 IMPLANT
TRAY FOLEY MTR SLVR 16FR STAT (SET/KITS/TRAYS/PACK) ×4 IMPLANT
TROCAR Z-THREAD FIOS 5X100MM (TROCAR) IMPLANT
UNDERPAD 30X36 HEAVY ABSORB (UNDERPADS AND DIAPERS) ×8 IMPLANT
WATER STERILE IRR 1000ML POUR (IV SOLUTION) ×4 IMPLANT
YANKAUER SUCT BULB TIP 10FT TU (MISCELLANEOUS) IMPLANT

## 2022-04-13 NOTE — Op Note (Signed)
OPERATIVE NOTE  Pre-operative Diagnosis: BRCA2 mutation, h/o cervical cancer s/p radical hysterectomy  Post-operative Diagnosis: same, significant retroperitoneal fibrosis  Operation: Robotic-assisted laparoscopic bilateral salpingo-oophorectomy, lysis of adhesions for approximately 20 minutes, cystoscopy   Surgeon: Jeral Pinch MD  Assistant Surgeon: Joylene John NP  Anesthesia: GET  Urine Output: 300cc  Operative Findings:  On EUA, uterus/cervix surgically absent, no masses. On intra-abdominal entry, normal upper abdominal survey. Right adnexa adherent to the peritoneum overlaying the aorta. On the left, adnexa adherent along left pelvic sidewall (some clips noted although difficult to distinguish whether used to hemostasis or to identify the ovary). No adenopathy. No ascites. No pelvic evidence of disease. Sigmoid with adhesions to the left abdominal wall, left IP ligament, pelvic sidewall and bladder peritoneum.  On cystoscopy, bladder dome intact, good efflux noted from bilateral ureteral orifices.  Estimated Blood Loss:  75cc      Total IV Fluids: see I&O flowsheet         Specimens: bilateral tubes and ovaries, pelvic washings         Complications:  None apparent; patient tolerated the procedure well.         Disposition: PACU - hemodynamically stable.  Procedure Details  The patient was seen in the Holding Room. The risks, benefits, complications, treatment options, and expected outcomes were discussed with the patient.  The patient concurred with the proposed plan, giving informed consent.  The site of surgery properly noted/marked. The patient was identified as Kathy Christensen and the procedure verified as a Robotic-assisted bilateral salpingo-oophorectomy with any other indicated procedures.   After induction of anesthesia, the patient was draped and prepped in the usual sterile manner. Patient was placed in supine position after anesthesia and draped and prepped in  the usual sterile manner as follows: Her arms were tucked to her side with all appropriate precautions.  The shoulders were stabilized with padded shoulder blocks applied to the acromium processes.  The patient was placed in the semi-lithotomy position in [place].  The perineum and vagina were prepped with CholoraPrep. The patient was draped after the CholoraPrep had been allowed to dry for 3 minutes.  A Time Out was held and the above information confirmed.  The urethra was prepped with Betadine. Foley catheter was placed.  OG tube placement was confirmed and to suction.   Next, a 10 mm skin incision was made 1 cm below the subcostal margin in the midclavicular line.  The 5 mm Optiview port and scope was used for direct entry.  Opening pressure was under 10 mm CO2.  The abdomen was insufflated and the findings were noted as above.   At this point and all points during the procedure, the patient's intra-abdominal pressure did not exceed 15 mmHg. Next, an 8 mm skin incision was made to the left and superior to the umbilicus and a right and left port were placed about 8 cm lateral to the robot port on the right and left side.  A fourth arm was placed on the right.  All ports were placed under direct visualization.  The patient was placed in steep Trendelenburg. The robot was docked in the normal manner.  Pelvic washings were collected.  Adhesions oft he ileum to the right sidewall at the pelvic brim were lysed sharply, allowing for reflection of the ileum. After the sigmoid was displaced to the left, the right ovary and tube were noted to be adherent to the peritoneum over the bifurcation of the aorta and within the  upper presacral space. With tension held to the elevate the tube, the ovary was dissected free of the underlying aorta with a combination of blunt and sharp dissection and short bursts of electrocautery, ultimately freeing the adnexa. The ureter was visualized transperitoneally and the IP  ligament was skeletonized, cauterized and cute approximately 2cm from the proximal aspect of the ovary. The adnexa was placed in the pelvis.   Attention was then turned to the left. Adhesions of the sigmoid epiploica to the left sidewall, IP ligament, and left pelvis were lysed, allowing mobilization of the sigmoid medially. The left ovary was adherent deep within the pelvis to the sidewall. With traction on the IP ligament, the left peritoneum was opened parallel to the IP ligament to open the retroperitoneal space. Attention was turned inferiorly where a combination of blunt and sharp dissection were used to free the distal ovary from the pelvic sidewall. The adnexa was mobilized along the broad ligament. Given degree of fibrosis and prior radical dissection, identification of the ureter was quite challenging on the left. Prior to cauterization of the IP ligament, the foley catheter was removed and cystoscopy was performed after 200cc of sterile fluid was instilled in the bladder and excellent efflux was noted from bilateral ureteral orifices. Subsequently, the left IP ligament was cauterized and transected, freeing the left adnexa. Bilateral adnexa were placed in an Endocatch bag inserted through one of the 66m robotic trocars.  Irrigation was used and excellent hemostasis was achieved. Given degree of dissection required along deep left pelvic sidewall, Floseal was placed. At this point in the procedure was completed.  Robotic instruments were removed under direct visulaization.  The robot was undocked. The specimen was removed through the right lateral incision. The deep subcutaneous tissue at the 8 mm lateral port site was closed with 0 Vicryl on a UR-5 needle.  The subcuticular tissue was closed with 4-0 Vicryl and the skin was closed with 4-0 Monocryl in a subcuticular manner.  Dermabond was applied.    All sponge, lap and needle counts were correct x  3.   The patient was transferred to the recovery  room in stable condition.  KJeral Pinch MD

## 2022-04-13 NOTE — H&P (Signed)
Gynecologic Oncology History and Physical  04/13/22  Treatment History: Patient underwent genetic testing with [facility] in 09/2021.  Family history is notable for ovarian cancer in her mother and multiple maternal relatives with history of breast cancer.  Testing revealed a pathogenic mutation in BRCA2, [id number].  She was also found to have a VUS in [place], [id number]. Pelvic ultrasound exam performed at [facility] on 12/09/2021 was unable to evaluate bilateral adnexa due to shadowing from bowel.   Patient reports doing well.  She denies any pelvic or abdominal pain.  She endorses normal bowel and bladder function.  She denies any postmenopausal bleeding or discharge.  She endorses a good appetite without nausea or emesis.   Her GYN history is notable for what early stage cervix cancer treated in the 1990s with radical hysterectomy.  In discussing this with the patient today.  She has surgery with an oncologist in [facility] that she describes as a vaginal hysterectomy with laparoscopic lymph node evaluation.  It sounds like she had a radical hysterectomy as she required a Foley catheter postoperatively.  She is unsure about her fallopian tubes, but was told that her ovaries were left in situ at the time of surgery and pexed out of her pelvis.  This was given concerned that she may need radiation although she did not require any adjuvant treatment.  During the time that she was treated for and followed after for her cervix cancer, she saw various oncologists and GYNs in [place], at [place] and women's in [place], in [place], and ultimately someone here in [place].  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   Patient is meeting with Dr. Chryl Heck today given her BRCA2 mutation.  Interval History: Doing well since last visit.  Past Medical/Surgical History: Past Medical History:  Diagnosis Date   BRCA2 gene mutation positive in female    Cervix cancer ([place])     Hypertension    Pituitary macroadenoma ([place])     Past Surgical History:  Procedure Laterality Date   ANKLE SURGERY     BREAST BIOPSY Left 2010   BREAST BIOPSY Left 04/12/2022   LEG SURGERY Right    plate and pins   LIPOMA EXCISION     back of shoulder   VAGINAL HYSTERECTOMY  1997   [facility] LND for early cervix cancer    Family History  Problem Relation Age of Onset   Ovarian cancer Mother    Breast cancer Maternal Aunt    Breast cancer Maternal Grandmother    Prostate cancer Paternal Grandfather     Social History   Socioeconomic History   Marital status: Single    Spouse name: Not on file   Number of children: Not on file   Years of education: Not on file   Highest education level: Not on file  Occupational History   Occupation: works for bank  Tobacco Use   Smoking status: Former    Types: Cigarettes    Quit date: 1993    Years since quitting: 30.4   Smokeless tobacco: Never  Vaping Use   Vaping Use: Never used  Substance and Sexual Activity   Alcohol use: Yes    Alcohol/week: 3.0 standard drinks    Types: 3 Standard drinks or equivalent per week   Drug use: Not Currently   Sexual activity: Not Currently  Other Topics Concern   Not on file  Social History Narrative   Not on file   Social  Determinants of Health   Financial Resource Strain: Not on file  Food Insecurity: Not on file  Transportation Needs: Not on file  Physical Activity: Not on file  Stress: Not on file  Social Connections: Not on file    Current Medications:  Current Facility-Administered Medications:    dexamethasone (DECADRON) injection 4 mg, 4 mg, Intravenous, On Call to OR, Cross, Carollee Massed, NP   lactated ringers infusion, , Intravenous, Continuous, Effie Berkshire, MD, Last Rate: 10 mL/hr at 04/13/22 0623, New Bag at 04/13/22 1448  Review of Systems: Denies appetite changes, fevers, chills, fatigue, unexplained weight changes. Denies hearing loss, neck lumps or masses, mouth  sores, ringing in ears or voice changes. Denies cough or wheezing.  Denies shortness of breath. Denies chest pain or palpitations. Denies leg swelling. Denies abdominal distention, pain, blood in stools, constipation, diarrhea, nausea, vomiting, or early satiety. Denies pain with intercourse, dysuria, frequency, hematuria or incontinence. Denies hot flashes, pelvic pain, vaginal bleeding or vaginal discharge.   Denies joint pain, back pain or muscle pain/cramps. Denies itching, rash, or wounds. Denies dizziness, headaches, numbness or seizures. Denies swollen lymph nodes or glands, denies easy bruising or bleeding. Denies anxiety, depression, confusion, or decreased concentration.  Physical Exam: BP 129/89   Pulse 72   Temp 98.2 F (36.8 C) (Oral)   Resp 16   Ht 5' 3"  (1.6 m)   Wt 244 lb 10.6 oz (111 kg)   SpO2 94%   BMI 43.34 kg/m  General: Alert, oriented, no acute distress.  HEENT: Normocephalic, atraumatic. Sclera anicteric.  Chest: Clear to auscultation bilaterally. No wheezes, rhonchi, or rales. Cardiovascular: Regular rate and rhythm, no murmurs, rubs, or gallops.  Abdomen: Obese. Normoactive bowel sounds. Soft, nondistended, nontender to palpation. No masses or hepatosplenomegaly appreciated. No palpable fluid wave.  Well-healed laparoscopic incisions. Extremities: Grossly normal range of motion. Warm, well perfused. No edema bilaterally.  Skin: No rashes or lesions.   Laboratory & Radiologic Studies: CBC    Component Value Date/Time   WBC 7.8 04/02/2022 1100   RBC 4.58 04/02/2022 1100   HGB 13.2 04/02/2022 1100   HCT 40.7 04/02/2022 1100   PLT 290 04/02/2022 1100   MCV 88.9 04/02/2022 1100   MCH 28.8 04/02/2022 1100   MCHC 32.4 04/02/2022 1100   RDW 15.0 04/02/2022 1100      Latest Ref Rng & Units 04/02/2022   11:00 AM  BMP  Glucose 70 - 99 mg/dL 97    BUN 8 - 23 mg/dL 15    Creatinine 0.44 - 1.00 mg/dL 0.88    Sodium 135 - 145 mmol/L 140    Potassium 3.5  - 5.1 mmol/L 3.9    Chloride 98 - 111 mmol/L 107    CO2 22 - 32 mmol/L 24    Calcium 8.9 - 10.3 mg/dL 9.5     Assessment & Plan: Kathy Christensen is a 66 y.o. woman with pathogenic BRCA2 mutation.  Plan for RRBSO today. See counseling from 02/19/22.  Jeral Pinch, MD  Division of Gynecologic Oncology  Department of Obstetrics and Gynecology  [facility]

## 2022-04-13 NOTE — Anesthesia Procedure Notes (Addendum)
Procedure Name: Intubation Date/Time: 04/13/2022 7:40 AM Performed by: Cleda Daub, CRNA Pre-anesthesia Checklist: Patient identified, Emergency Drugs available, Suction available and Patient being monitored Patient Re-evaluated:Patient Re-evaluated prior to induction Oxygen Delivery Method: Circle system utilized Preoxygenation: Pre-oxygenation with 100% oxygen Induction Type: IV induction Ventilation: Mask ventilation without difficulty Laryngoscope Size: Mac and 3 Grade View: Grade III Tube type: Oral Number of attempts: 2 (anterior airway; Grade 3 view with MAC 3; repositioned sniffing position; able to intuabe on the second attempt.) Airway Equipment and Method: Stylet and Oral airway Placement Confirmation: ETT inserted through vocal cords under direct vision, positive ETCO2 and breath sounds checked- equal and bilateral Secured at: 21 cm Tube secured with: Tape Dental Injury: Teeth and Oropharynx as per pre-operative assessment

## 2022-04-13 NOTE — Anesthesia Preprocedure Evaluation (Signed)
Anesthesia Evaluation  Patient identified by MRN, date of birth, ID band Patient awake    Reviewed: Allergy & Precautions, NPO status , Patient's Chart, lab work & pertinent test results  Airway Mallampati: II  TM Distance: >3 FB Neck ROM: Full    Dental no notable dental hx.    Pulmonary neg pulmonary ROS, former smoker,    Pulmonary exam normal breath sounds clear to auscultation       Cardiovascular hypertension, Normal cardiovascular exam Rhythm:Regular Rate:Normal     Neuro/Psych negative neurological ROS  negative psych ROS   GI/Hepatic negative GI ROS, Neg liver ROS,   Endo/Other  Morbid obesity  Renal/GU negative Renal ROS  negative genitourinary   Musculoskeletal negative musculoskeletal ROS (+)   Abdominal   Peds negative pediatric ROS (+)  Hematology negative hematology ROS (+)   Anesthesia Other Findings   Reproductive/Obstetrics negative OB ROS                             Anesthesia Physical Anesthesia Plan  ASA: 2  Anesthesia Plan: General   Post-op Pain Management: Tylenol PO (pre-op)*   Induction: Intravenous  PONV Risk Score and Plan: 3 and Ondansetron, Dexamethasone, Droperidol and Treatment may vary due to age or medical condition  Airway Management Planned: Oral ETT  Additional Equipment:   Intra-op Plan:   Post-operative Plan: Extubation in OR  Informed Consent: I have reviewed the patients History and Physical, chart, labs and discussed the procedure including the risks, benefits and alternatives for the proposed anesthesia with the patient or authorized representative who has indicated his/her understanding and acceptance.     Dental advisory given  Plan Discussed with: CRNA and Surgeon  Anesthesia Plan Comments:         Anesthesia Quick Evaluation

## 2022-04-13 NOTE — Progress Notes (Signed)
Transition of Care [facility]) Screening Note  Patient Details  Name: Kathy Christensen Date of Birth: 12/11/1955  Transition of Care [facility]) CM/SW Contact:    Sherie Don, LCSW Phone Number: 04/13/2022, 3:24 PM  Transition of Care Department [facility]) has reviewed patient and no TOC needs have been identified at this time. We will continue to monitor patient advancement through interdisciplinary progression rounds. If new patient transition needs arise, please place a TOC consult.

## 2022-04-13 NOTE — Anesthesia Postprocedure Evaluation (Signed)
Anesthesia Post Note  Patient: JO-ANN JOHANNING  Procedure(s) Performed: XI ROBOTIC ASSISTED BILATERAL SALPINGO OOPHORECTOMY, CYSTOSCOPY (Bilateral: Abdomen)     Patient location during evaluation: PACU Anesthesia Type: General Level of consciousness: awake and alert Pain management: pain level controlled Vital Signs Assessment: post-procedure vital signs reviewed and stable Respiratory status: spontaneous breathing, nonlabored ventilation, respiratory function stable and patient connected to nasal cannula oxygen Cardiovascular status: blood pressure returned to baseline and stable Postop Assessment: no apparent nausea or vomiting Anesthetic complications: no   No notable events documented.  Last Vitals:  Vitals:   04/13/22 1115 04/13/22 1130  BP: (!) 157/95 (!) 144/93  Pulse: 88 98  Resp: 15 18  Temp:  (!) 36.4 C  SpO2: 96% 97%    Last Pain:  Vitals:   04/13/22 1130  TempSrc:   PainSc: 3                  Loreda Silverio S

## 2022-04-13 NOTE — Transfer of Care (Signed)
Immediate Anesthesia Transfer of Care Note  Patient: Kathy Christensen  Procedure(s) Performed: XI ROBOTIC ASSISTED BILATERAL SALPINGO OOPHORECTOMY, CYSTOSCOPY (Bilateral: Abdomen)  Patient Location: PACU  Anesthesia Type:General  Level of Consciousness: awake, alert , oriented and patient cooperative  Airway & Oxygen Therapy: Patient Spontanous Breathing and Patient connected to face mask oxygen  Post-op Assessment: Report given to RN and Post -op Vital signs reviewed and stable  Post vital signs: Reviewed and stable  Last Vitals:  Vitals Value Taken Time  BP 150/87 04/13/22 1001  Temp    Pulse 93 04/13/22 1003  Resp 20 04/13/22 1003  SpO2 95 % 04/13/22 1003  Vitals shown include unvalidated device data.  Last Pain:  Vitals:   04/13/22 0556  TempSrc: Oral  PainSc:       Patients Stated Pain Goal: 2 (87/19/59 7471)  Complications: No notable events documented.

## 2022-04-13 NOTE — Discharge Instructions (Signed)
AFTER SURGERY INSTRUCTIONS   Return to work: 4-6 weeks if applicable   Activity: 1. Be up and out of the bed during the day.  Take a nap if needed.  You may walk up steps but be careful and use the hand rail.  Stair climbing will tire you more than you think, you may need to stop part way and rest.    2. No lifting or straining for 6 weeks over 10 pounds. No pushing, pulling, straining for 6 weeks.   3. No driving for around 1 week(s).  Do not drive if you are taking narcotic pain medicine and make sure that your reaction time has returned.    4. You can shower as soon as the next day after surgery. Shower daily.  Use your regular soap and water (not directly on the incision) and pat your incision(s) dry afterwards; don't rub.  No tub baths or submerging your body in water until cleared by your surgeon. If you have the soap that was given to you by pre-surgical testing that was used before surgery, you do not need to use it afterwards because this can irritate your incisions.    5. No sexual activity and nothing in the vagina for 4 weeks.   6. You may experience a small amount of clear drainage from your incisions, which is normal.  If the drainage persists, increases, or changes color please call the office.   7. Do not use creams, lotions, or ointments such as neosporin on your incisions after surgery until advised by your surgeon because they can cause removal of the dermabond glue on your incisions.     8. Take Tylenol or ibuprofen first for pain IF YOU ARE ABLE TO TAKE THESE MEDICATIONS and only use narcotic pain medication for severe pain not relieved by the Tylenol or Ibuprofen.  Monitor your Tylenol intake to a max of 4,000 mg in a 24 hour period. You can alternate these medications after surgery.   Diet: 1. Low sodium Heart Healthy Diet is recommended but you are cleared to resume your normal (before surgery) diet after your procedure.   2. It is safe to use a laxative, such as  Miralax or Colace, if you have difficulty moving your bowels. You have been prescribed Sennakot-S to take at bedtime every evening after surgery to keep bowel movements regular and to prevent constipation.     Wound Care: 1. Keep clean and dry.  Shower daily.   Reasons to call the Doctor: Fever - Oral temperature greater than 100.4 degrees Fahrenheit Foul-smelling vaginal discharge Difficulty urinating Nausea and vomiting Increased pain at the site of the incision that is unrelieved with pain medicine. Difficulty breathing with or without chest pain New calf pain especially if only on one side Sudden, continuing increased vaginal bleeding with or without clots.   Contacts: For questions or concerns you should contact:   Dr. Jeral Pinch at [phone]   Joylene John, NP at [phone]   After Hours: call [phone] and have the GYN Oncologist paged/contacted (after 5 pm or on the weekends).   Messages sent via mychart are for non-urgent matters and are not responded to after hours so for urgent needs, please call the after hours number.

## 2022-04-14 ENCOUNTER — Encounter (HOSPITAL_COMMUNITY): Payer: Self-pay | Admitting: Gynecologic Oncology

## 2022-04-14 DIAGNOSIS — N135 Crossing vessel and stricture of ureter without hydronephrosis: Secondary | ICD-10-CM | POA: Diagnosis not present

## 2022-04-14 LAB — CYTOLOGY - NON PAP

## 2022-04-14 LAB — SURGICAL PATHOLOGY

## 2022-04-14 NOTE — Discharge Summary (Signed)
Physician Discharge Summary  Patient ID: Kathy Christensen MRN: [id number] DOB/AGE: 66-Mar-1957 66 y.o.  Admit date: 04/13/2022 Discharge date: 04/16/2022  Admission Diagnoses: BRCA2 positive  Discharge Diagnoses:  Principal Problem:   BRCA2 positive  Discharged Condition:  The patient is in good condition and stable for discharge.    Hospital Course: On 04/13/2022, the patient underwent the following: Procedure(s): XI ROBOTIC ASSISTED BILATERAL SALPINGO OOPHORECTOMY, CYSTOSCOPY. The postoperative course was uneventful.  She was discharged to home on postoperative day 1 tolerating a regular diet, passing flatus, tolerating diet, pain controlled, voiding without difficulty.   Consults: None  Significant Diagnostic Studies: None  Treatments: surgery: see above  Discharge Exam: Blood pressure (!) 128/91, pulse 85, temperature 98.4 F (36.9 C), temperature source Oral, resp. rate 16, height 5' 3"  (1.6 m), weight 244 lb 10.6 oz (111 kg), SpO2 96 %. General appearance: alert, cooperative, and no distress Resp: clear to auscultation bilaterally Cardio: regular rate and rhythm, S1, S2 normal, no murmur, click, rub or gallop GI: soft, non-tender; bowel sounds normal; no masses,  no organomegaly Extremities: extremities normal, atraumatic, no cyanosis or edema Incision/Wound: lap sites to the abdomen with dermabond without erythema or drainage  Disposition: Discharge disposition: 01-Home or Self Care       Discharge Instructions     Call MD for:  difficulty breathing, headache or visual disturbances   Complete by: As directed    Call MD for:  extreme fatigue   Complete by: As directed    Call MD for:  hives   Complete by: As directed    Call MD for:  persistant dizziness or light-headedness   Complete by: As directed    Call MD for:  persistant nausea and vomiting   Complete by: As directed    Call MD for:  redness, tenderness, or signs of infection (pain, swelling, redness, odor  or green/yellow discharge around incision site)   Complete by: As directed    Call MD for:  severe uncontrolled pain   Complete by: As directed    Call MD for:  temperature >100.4   Complete by: As directed    Diet - low sodium heart healthy   Complete by: As directed    Driving Restrictions   Complete by: As directed    No driving for around 1 week(s).  Do not take narcotics and drive. You need to make sure your reaction time has returned.   Increase activity slowly   Complete by: As directed    Lifting restrictions   Complete by: As directed    No lifting greater than 10 lbs, pushing, pulling, straining for 6 weeks.   Sexual Activity Restrictions   Complete by: As directed    No sexual activity, nothing in the vagina, for 4 weeks.      Allergies as of 04/14/2022       Reactions   Penicillin G Hives        Medication List     TAKE these medications    cholecalciferol 25 MCG (1000 UNIT) tablet Commonly known as: VITAMIN D3 Take 1,000 Units by mouth 2 (two) times a week.   hydrochlorothiazide 12.5 MG tablet Commonly known as: HYDRODIURIL Take 12.5 mg by mouth daily.   Prevagen 10 MG Caps Generic drug: Apoaequorin Take 10 mg by mouth 2 (two) times a week.   senna-docusate 8.6-50 MG tablet Commonly known as: Senokot-S Take 2 tablets by mouth at bedtime. For AFTER surgery, do not take if having diarrhea  traMADol 50 MG tablet Commonly known as: ULTRAM Take 1 tablet (50 mg total) by mouth every 6 (six) hours as needed for severe pain. For AFTER surgery only, do not take and drive        Follow-up Information     Lafonda Mosses, MD Follow up on 05/10/2022.   Specialty: Gynecologic Oncology Why: at 3pm at [facility] for post-op check. Contact information: [place] [address] [phone]                 Greater than thirty minutes were spend for face to face discharge instructions and discharge orders/summary in  EPIC.   Signed: Dorothyann Gibbs 04/16/2022, 5:06 PM

## 2022-04-14 NOTE — Progress Notes (Signed)
Reviewed written d/c instructions w pt and all questions answered. She verbalized understanding. D/C ambulatory w all belongings in stable condition. 

## 2022-04-15 ENCOUNTER — Telehealth: Payer: Self-pay

## 2022-04-15 NOTE — Telephone Encounter (Signed)
Attempted to reach patient to check in with her post-operatively. Unable to reach patient. Left message requesting return call.   

## 2022-04-16 ENCOUNTER — Telehealth: Payer: Self-pay | Admitting: *Deleted

## 2022-04-16 NOTE — Telephone Encounter (Signed)
Spoke with Gerald Stabs this morning who stated that he spoke with the patient yesterday and she stated that she's turning her phone off for a while to rest and recover but he will text her to let her know that we are trying to get in touch with her and for her to give [place] a call back.   Attempted to speak to pt this morning for post op follow up. Unable to reach her. LVM for return call.

## 2022-04-20 ENCOUNTER — Telehealth: Payer: Self-pay | Admitting: *Deleted

## 2022-04-20 ENCOUNTER — Encounter: Payer: Self-pay | Admitting: *Deleted

## 2022-04-20 ENCOUNTER — Telehealth: Payer: Self-pay

## 2022-04-20 NOTE — Telephone Encounter (Signed)
Spoke with Ms. Guerin this morning. She states she is eating, drinking and urinating well. She is having regular bowel movements. She denies fever or chills. Incisions are dry and intact. She reports some itching at the incision site, denies redness, swelling, warmth or drainage. Patient states she normally takes benadryl and inquired if it is ok to take. Advised not to take benadryl and tramadol together as it can be sedating. "Ok, I never picked up my tramadol prescription. I've just been using tylenol and advil." She rates her pain 3/10 and states its more "discomfort" than pain.   Notified patient that we have received her FMLA paperwork, inquired if she would prefer to be out for 4 or 6 weeks. Patient would prefer to be out for 6 weeks. Instructed that the office will work on completing this.    Instructed to call office with any fever, chills, purulent drainage, uncontrolled pain or any other questions or concerns. Patient verbalizes understanding.   Pt aware of post op appointments as well as the office number [phone] and after hours number [phone] to call if she has any questions or concerns

## 2022-04-20 NOTE — Telephone Encounter (Signed)
Fax FMLA/disability forms and records to [facility]

## 2022-04-23 ENCOUNTER — Telehealth: Payer: Self-pay | Admitting: Hematology and Oncology

## 2022-04-23 NOTE — Telephone Encounter (Signed)
.  Called patient to schedule appointment per 5/30 inbasket, left pt msg 

## 2022-05-06 ENCOUNTER — Encounter: Payer: Self-pay | Admitting: Gynecologic Oncology

## 2022-05-10 ENCOUNTER — Other Ambulatory Visit: Payer: Self-pay

## 2022-05-10 ENCOUNTER — Encounter: Payer: Self-pay | Admitting: Gynecologic Oncology

## 2022-05-10 ENCOUNTER — Inpatient Hospital Stay: Payer: Managed Care, Other (non HMO) | Attending: Hematology and Oncology | Admitting: Gynecologic Oncology

## 2022-05-10 VITALS — BP 131/68 | HR 86 | Temp 98.2°F | Resp 18 | Ht 64.0 in | Wt 238.0 lb

## 2022-05-10 DIAGNOSIS — Z148 Genetic carrier of other disease: Secondary | ICD-10-CM

## 2022-05-10 DIAGNOSIS — Z90722 Acquired absence of ovaries, bilateral: Secondary | ICD-10-CM

## 2022-05-10 DIAGNOSIS — Z1509 Genetic susceptibility to other malignant neoplasm: Secondary | ICD-10-CM

## 2022-05-10 DIAGNOSIS — Z8541 Personal history of malignant neoplasm of cervix uteri: Secondary | ICD-10-CM

## 2022-05-10 DIAGNOSIS — Z1502 Genetic susceptibility to malignant neoplasm of ovary: Secondary | ICD-10-CM

## 2022-05-10 DIAGNOSIS — Z7189 Other specified counseling: Secondary | ICD-10-CM

## 2022-05-10 DIAGNOSIS — Z1501 Genetic susceptibility to malignant neoplasm of breast: Secondary | ICD-10-CM

## 2022-05-10 NOTE — Patient Instructions (Signed)
It was good to see you today.  You are healing very well from surgery.  Remember, no heavy lifting for 6 weeks after surgery.  From an ovarian cancer standpoint, after your surgery, your risk of ovarian/fallopian tube/primary peritoneal cancer is significantly decreased although there is still a small risk of developing primary peritoneal cancer.  You should continue to have an appointment with your primary care doctor once a year to review any symptoms she might be having.  If you were to develop abdominal pain, bloating, change to bowel function, unintentional weight loss, or any other concerning symptoms, please call to be seen.

## 2022-05-10 NOTE — Progress Notes (Signed)
Gynecologic Oncology Return Clinic Visit  05/10/22  Reason for Visit: post-operative follow-up, treatment planning  Treatment History: 04/13/2022: Robotic assisted BSO, lysis of adhesion, cystoscopy. Findings:  On EUA, uterus/cervix surgically absent, no masses. On intra-abdominal entry, normal upper abdominal survey. Right adnexa adherent to the peritoneum overlaying the aorta. On the left, adnexa adherent along left pelvic sidewall (some clips noted although difficult to distinguish whether used to hemostasis or to identify the ovary). No adenopathy. No ascites. No pelvic evidence of disease. Sigmoid with adhesions to the left abdominal wall, left IP ligament, pelvic sidewall and bladder peritoneum.  On cystoscopy, bladder dome intact, good efflux noted from bilateral ureteral orifices.  Interval History: Doing well.  Denies any abdominal or pelvic pain.  Denies any issues with her incisions.  Reports baseline bowel and bladder function.  Has noted some hot flashes after surgery as well as fatigue.  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[facility])     Past Surgical History:  Procedure Laterality Date   ANKLE SURGERY     BREAST BIOPSY Left 2010   BREAST BIOPSY Left 04/12/2022   LEG SURGERY Right    plate and pins   LIPOMA EXCISION     back of shoulder   ROBOTIC ASSISTED BILATERAL SALPINGO OOPHERECTOMY Bilateral 04/13/2022   Procedure: XI ROBOTIC ASSISTED BILATERAL SALPINGO OOPHORECTOMY, CYSTOSCOPY;  Surgeon: Lafonda Mosses, MD;  Location: [facility];  Service: Gynecology;  Laterality: Bilateral;   VAGINAL HYSTERECTOMY  1997   lsc LND for early cervix cancer    Family History  Problem Relation Age of Onset   Ovarian cancer Mother    Breast cancer Maternal Aunt    Breast cancer Maternal Grandmother    Prostate cancer Paternal Grandfather      Social History   Socioeconomic History   Marital status: Single    Spouse name: Not on file   Number of children: Not on file   Years of education: Not on file   Highest education level: Not on file  Occupational History   Occupation: works for bank  Tobacco Use   Smoking status: Former    Types: Cigarettes    Quit date: 1993    Years since quitting: 30.4   Smokeless tobacco: Never  Vaping Use   Vaping Use: Never used  Substance and Sexual Activity   Alcohol use: Yes    Alcohol/week: 3.0 standard drinks of alcohol    Types: 3 Standard drinks or equivalent per week   Drug use: Not Currently   Sexual activity: Not Currently  Other Topics Concern   Not on file  Social History Narrative   Not on file   Social Determinants of Health   Financial Resource Strain: Not on file  Food Insecurity: Not on file  Transportation Needs: Not on file  Physical Activity: Not on file  Stress: Not on file  Social Connections: Not on file    Current Medications:  Current Outpatient Medications:    Apoaequorin (PREVAGEN) 10 MG CAPS, Take 10 mg by mouth 2 (two) times a week., Disp: , Rfl:    cholecalciferol (VITAMIN D3) 25 MCG (1000 UNIT) tablet, Take 1,000 Units by mouth 2 (two) times a week., Disp: , Rfl:    hydrochlorothiazide (HYDRODIURIL) 12.5 MG tablet, Take 12.5 mg by mouth daily., Disp: , Rfl:   Review of Systems: +  fatigue Denies appetite changes, fevers, chills, unexplained weight changes. Denies hearing loss, neck lumps or masses, mouth sores, ringing in ears or voice changes. Denies cough or wheezing.  Denies shortness of breath. Denies chest pain or palpitations. Denies leg swelling. Denies abdominal distention, pain, blood in stools, constipation, diarrhea, nausea, vomiting, or early satiety. Denies pain with intercourse, dysuria, frequency, hematuria or incontinence. Denies pelvic pain, vaginal bleeding or vaginal discharge.   Denies joint pain, back pain or muscle  pain/cramps. Denies itching, rash, or wounds. Denies dizziness, headaches, numbness or seizures. Denies swollen lymph nodes or glands, denies easy bruising or bleeding. Denies anxiety, depression, confusion, or decreased concentration.  Physical Exam: BP 131/68 (BP Location: Right Arm, Patient Position: Sitting)   Pulse 86   Temp 98.2 F (36.8 C) (Oral)   Resp 18   Ht _0  (1.626 m)   Wt 238 lb (108 kg)   SpO2 99%   BMI 40.85 kg/m  General: Alert, oriented, no acute distress. HEENT: Normocephalic, atraumatic, sclera anicteric. Chest: Unlabored breathing on room air. Abdomen: Obese, soft, nontender.  No masses or hepatosplenomegaly appreciated.  Well-healed incisions. Extremities: Grossly normal range of motion.  Warm, well perfused.  No edema bilaterally.  Laboratory & Radiologic Studies: A. FALLOPIAN TUBES AND OVARIES, BILATERAL, SALPINGO OOPHORECTOMY:  - Benign ovaries with unilateral ovarian hemangioma.  - Benign fallopian tubes with benign paratubal cyst.  - Negative for endometriosis or malignancy.  - See comment.   COMMENT:  The specimens are serially sectioned and entirely submitted for  histologic examination.  Assessment & Plan: Kathy Christensen is a 66 y.o. woman s/p risk-reducing BSO in the setting of BRCA2 mutation.  Patient is doing well from a postoperative standpoint.  Discussed continued expectations and restrictions.  Discussed that even in women who are menopausal, sometimes after removal of their ovaries, there can be a change in hormonal status that causes hot flashes.  Patient feels that overall she is tolerating these well and is not ready to move forward with any treatment for her hot flashes although has been exploring some over-the-counter options.  We discussed possible treatment options.  I asked her to reach out to me if she becomes more symptomatic or would like a prescription medication (such as Effexor).   The patient will be discharged back to  her PCP and the rest of her care team.  We discussed the importance of at least a yearly visit with her primary care provider, OB/GYN, or another practitioner for review of symptoms.  She and I discussed signs and symptoms that would be concerning for primary peritoneal carcinoma and I stressed the importance of calling to be seen if she develops any of these.  20 minutes of total time was spent for this patient encounter, including preparation, face-to-face counseling with the patient and coordination of care, and documentation of the encounter.  Jeral Pinch, MD  Division of Gynecologic Oncology  Department of Obstetrics and Gynecology  [facility]

## 2022-05-17 ENCOUNTER — Ambulatory Visit: Payer: Managed Care, Other (non HMO) | Admitting: Hematology and Oncology

## 2022-08-20 ENCOUNTER — Inpatient Hospital Stay: Payer: Managed Care, Other (non HMO)

## 2022-08-20 ENCOUNTER — Inpatient Hospital Stay: Payer: Managed Care, Other (non HMO) | Attending: Hematology and Oncology | Admitting: Hematology and Oncology

## 2022-08-20 ENCOUNTER — Encounter: Payer: Self-pay | Admitting: Hematology and Oncology

## 2022-08-20 ENCOUNTER — Other Ambulatory Visit: Payer: Self-pay | Admitting: *Deleted

## 2022-08-20 VITALS — BP 145/97 | HR 85 | Temp 97.5°F | Resp 14 | Ht 64.0 in | Wt 244.2 lb

## 2022-08-20 DIAGNOSIS — Z1509 Genetic susceptibility to other malignant neoplasm: Secondary | ICD-10-CM | POA: Diagnosis not present

## 2022-08-20 DIAGNOSIS — Z8541 Personal history of malignant neoplasm of cervix uteri: Secondary | ICD-10-CM

## 2022-08-20 DIAGNOSIS — Z1501 Genetic susceptibility to malignant neoplasm of breast: Secondary | ICD-10-CM | POA: Diagnosis not present

## 2022-08-20 DIAGNOSIS — Z79899 Other long term (current) drug therapy: Secondary | ICD-10-CM | POA: Diagnosis not present

## 2022-08-20 DIAGNOSIS — Z87891 Personal history of nicotine dependence: Secondary | ICD-10-CM | POA: Diagnosis not present

## 2022-08-20 DIAGNOSIS — Z1502 Genetic susceptibility to malignant neoplasm of ovary: Secondary | ICD-10-CM | POA: Diagnosis not present

## 2022-08-20 LAB — CBC WITH DIFFERENTIAL (CANCER CENTER ONLY)
Abs Immature Granulocytes: 0.01 10*3/uL (ref 0.00–0.07)
Basophils Absolute: 0.1 10*3/uL (ref 0.0–0.1)
Basophils Relative: 1 %
Eosinophils Absolute: 0.2 10*3/uL (ref 0.0–0.5)
Eosinophils Relative: 3 %
HCT: 40 % (ref 36.0–46.0)
Hemoglobin: 13.7 g/dL (ref 12.0–15.0)
Immature Granulocytes: 0 %
Lymphocytes Relative: 31 %
Lymphs Abs: 2.6 10*3/uL (ref 0.7–4.0)
MCH: 30.3 pg (ref 26.0–34.0)
MCHC: 34.3 g/dL (ref 30.0–36.0)
MCV: 88.5 fL (ref 80.0–100.0)
Monocytes Absolute: 0.5 10*3/uL (ref 0.1–1.0)
Monocytes Relative: 5 %
Neutro Abs: 5 10*3/uL (ref 1.7–7.7)
Neutrophils Relative %: 60 %
Platelet Count: 280 10*3/uL (ref 150–400)
RBC: 4.52 MIL/uL (ref 3.87–5.11)
RDW: 15 % (ref 11.5–15.5)
WBC Count: 8.4 10*3/uL (ref 4.0–10.5)
nRBC: 0 % (ref 0.0–0.2)

## 2022-08-20 LAB — CMP (CANCER CENTER ONLY)
ALT: 14 U/L (ref 0–44)
AST: 13 U/L — ABNORMAL LOW (ref 15–41)
Albumin: 4.3 g/dL (ref 3.5–5.0)
Alkaline Phosphatase: 74 U/L (ref 38–126)
Anion gap: 3 — ABNORMAL LOW (ref 5–15)
BUN: 11 mg/dL (ref 8–23)
CO2: 29 mmol/L (ref 22–32)
Calcium: 9.8 mg/dL (ref 8.9–10.3)
Chloride: 106 mmol/L (ref 98–111)
Creatinine: 0.83 mg/dL (ref 0.44–1.00)
GFR, Estimated: >60 mL/min (ref >60)
Glucose, Bld: 117 mg/dL — ABNORMAL HIGH (ref 70–99)
Potassium: 3.9 mmol/L (ref 3.5–5.1)
Sodium: 138 mmol/L (ref 135–145)
Total Bilirubin: 0.4 mg/dL (ref 0.3–1.2)
Total Protein: 7.8 g/dL (ref 6.5–8.1)

## 2022-08-20 NOTE — Progress Notes (Signed)
[name] CONSULT NOTE  Patient Care Team: [name], MD as PCP - General (Obstetrics and Gynecology)  CHIEF COMPLAINTS/PURPOSE OF CONSULTATION:  BRCA 2 pathogenic mutation  ASSESSMENT & PLAN:   This is a very pleasant 66 year old female patient with BRCA2 gene mutation, family history significant for breast and ovarian cancer followed by gynecology now referred to high risk breast clinic as well as gynecological oncology. She is now status post left breast lumpectomy, healing well. She is anticipating initiation of radiation in the next couple days.  She will start antiestrogen therapy after completion of radiation.  We once again discussed about options for antiestrogen therapy including tamoxifen versus aromatase inhibitors.  At this time she is interested in aromatase inhibitors.   She will also continue mammograms alternating with MRIs for breast cancer screening since she has a BRCA2 mutation and she is not interested in pursuing bilateral mastectomy.   She had bilateral salpingo-oophorectomy since her last visit.   We have once again discussed about dermatological evaluation as part of BRCA2 screening.  She does not remember any family history of pancreatic cancer hence we have discussed about common symptoms and signs of pancreatic cancer. She will try to alternate between Dr. Valentino Saxon and [place] for high risk breast cancer monitoring.  She will continue visits with Dr. Valentino Saxon every March and return to clinic with [place] in September.  Her next visit will be a virtual visit to once again discussed about options for antiestrogen therapy and to initiate.  HISTORY OF PRESENTING ILLNESS:   Kathy Christensen 66 y.o. female is here because of BRCA 2 gene mutation.  This is a 66 year old female patient with BRCA2 gene mutation referred to high risk breast clinic for additional recommendations.   Since last visit, she had lumpectomy which showed DCIS. She will be starting  radiation soon. She denies any new complaints. She drives 3 hours each way to come to her appointments and she was hoping for virtual appointment next time.  She has not yet decided as to which antiestrogen therapy but she met her gynecologist who recommended aromatase inhibitors.  She has been healing well as expected.  She will also continue MRIs alternating with mammograms.  She is not ready for bilateral mastectomy at this time.  Rest of the pertinent 10 point ROS reviewed and negative  MEDICAL HISTORY:  Past Medical History:  Diagnosis Date   BRCA2 gene mutation positive in female    Cervix cancer ([place])    Hypertension    Pituitary macroadenoma ([place])     SURGICAL HISTORY: Past Surgical History:  Procedure Laterality Date   ANKLE SURGERY     BREAST BIOPSY Left 2010   BREAST BIOPSY Left 04/12/2022   LEG SURGERY Right    plate and pins   LIPOMA EXCISION     back of shoulder   ROBOTIC ASSISTED BILATERAL SALPINGO OOPHERECTOMY Bilateral 04/13/2022   Procedure: XI ROBOTIC ASSISTED BILATERAL SALPINGO OOPHORECTOMY, CYSTOSCOPY;  Surgeon: Lafonda Mosses, MD;  Location: [facility];  Service: Gynecology;  Laterality: Bilateral;   VAGINAL HYSTERECTOMY  1997   lsc LND for early cervix cancer    SOCIAL HISTORY: Social History   Socioeconomic History   Marital status: Single    Spouse name: Not on file   Number of children: Not on file   Years of education: Not on file   Highest education level: Not on file  Occupational History   Occupation: works for [employer]  Tobacco Use  Smoking status: Former    Types: Cigarettes    Quit date: 1993    Years since quitting: 30.7   Smokeless tobacco: Never  Vaping Use   Vaping Use: Never used  Substance and Sexual Activity   Alcohol use: Yes    Alcohol/week: 3.0 standard drinks of alcohol    Types: 3 Standard drinks or equivalent per week   Drug use: Not Currently   Sexual activity: Not Currently  Other Topics Concern   Not on file  Social  History Narrative   Not on file   Social Determinants of Health   Financial Resource Strain: Not on file  Food Insecurity: Not on file  Transportation Needs: Not on file  Physical Activity: Not on file  Stress: Not on file  Social Connections: Not on file  Intimate Partner Violence: Not on file    FAMILY HISTORY: Family History  Problem Relation Age of Onset   Ovarian cancer Mother    Breast cancer Maternal Aunt    Breast cancer Maternal Grandmother    Prostate cancer Paternal Grandfather     ALLERGIES:  is allergic to penicillin g.  MEDICATIONS:  Current Outpatient Medications  Medication Sig Dispense Refill   Apoaequorin (PREVAGEN) 10 MG CAPS Take 10 mg by mouth 2 (two) times a week.     cholecalciferol (VITAMIN D3) 25 MCG (1000 UNIT) tablet Take 1,000 Units by mouth 2 (two) times a week.     hydrochlorothiazide (HYDRODIURIL) 12.5 MG tablet Take 12.5 mg by mouth daily.     No current facility-administered medications for this visit.     PHYSICAL EXAMINATION: ECOG PERFORMANCE STATUS: 0 - Asymptomatic  Vitals:   08/20/22 1227  BP: (!) 145/97  Pulse: 85  Resp: 14  Temp: (!) 97.5 F (36.4 C)  SpO2: 97%   Filed Weights   08/20/22 1227  Weight: 244 lb 3.2 oz (110.8 kg)    GENERAL:alert, no distress and comfortable SKIN: skin color, texture, turgor are normal, no rashes or significant lesions EYES: normal, conjunctiva are pink and non-injected, sclera clear OROPHARYNX:no exudate, no erythema and lips, buccal mucosa, and tongue normal  NECK: supple, thyroid normal size, non-tender, without nodularity LYMPH:  no palpable lymphadenopathy in the cervical, axillary or inguinal Breasts: Bilateral breast examined.  No palpable masses.  Right breast postlumpectomy, healed well.  Bilateral axilla clear.  No other regional adenopathy.  LABORATORY DATA:  I have reviewed the data as listed Lab Results  Component Value Date   WBC 8.4 08/20/2022   HGB 13.7 08/20/2022    HCT 40.0 08/20/2022   MCV 88.5 08/20/2022   PLT 280 08/20/2022     Chemistry      Component Value Date/Time   NA 138 08/20/2022 1208   K 3.9 08/20/2022 1208   CL 106 08/20/2022 1208   CO2 29 08/20/2022 1208   BUN 11 08/20/2022 1208   CREATININE 0.83 08/20/2022 1208      Component Value Date/Time   CALCIUM 9.8 08/20/2022 1208   ALKPHOS 74 08/20/2022 1208   AST 13 (L) 08/20/2022 1208   ALT 14 08/20/2022 1208   BILITOT 0.4 08/20/2022 1208     I have reviewed pertinent referral records.  RADIOGRAPHIC STUDIES: I have personally reviewed the radiological images as listed and agreed with the findings in the report. No results found.  All questions were answered. The patient knows to call the clinic with any problems, questions or concerns. I spent 30 minutes in the care of this  patient including H and P, review of records, counseling and coordination of care.     Benay Pike, MD 08/20/2022 12:55 PM

## 2022-09-11 IMAGING — MG MM BREAST LOCALIZATION CLIP
4 series · 4 of 12 positions shown · non-contrast
Comparison: Previous exam(s).

CLINICAL DATA: Post biopsy mammogram of the left breast for clip
placement.

EXAM:
3D DIAGNOSTIC LEFT MAMMOGRAM POST MRI BIOPSY

[L ML synth-2D]
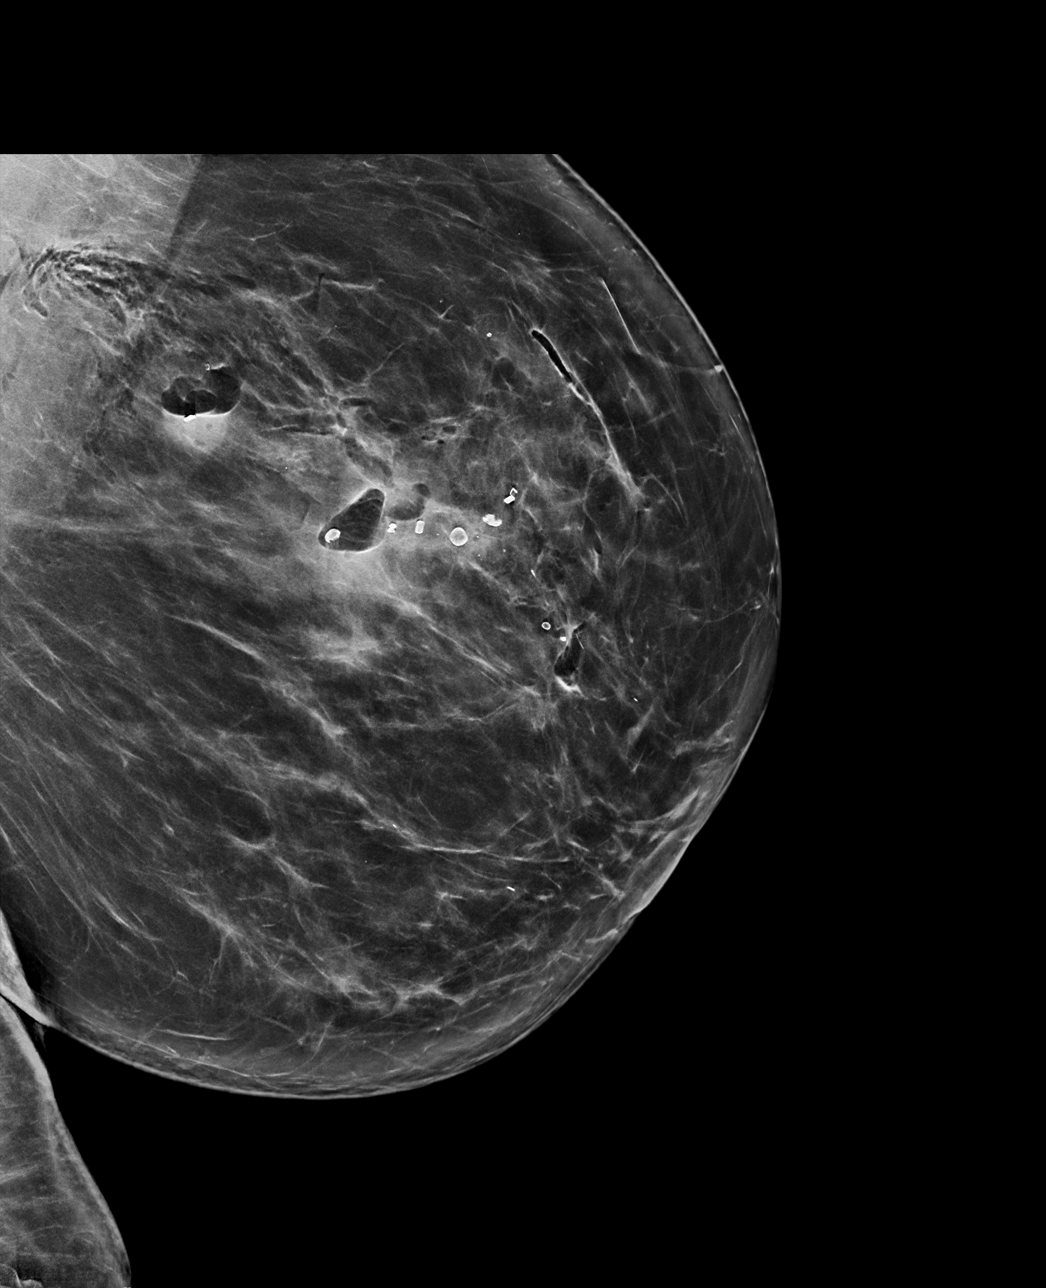

[L CC synth-2D]
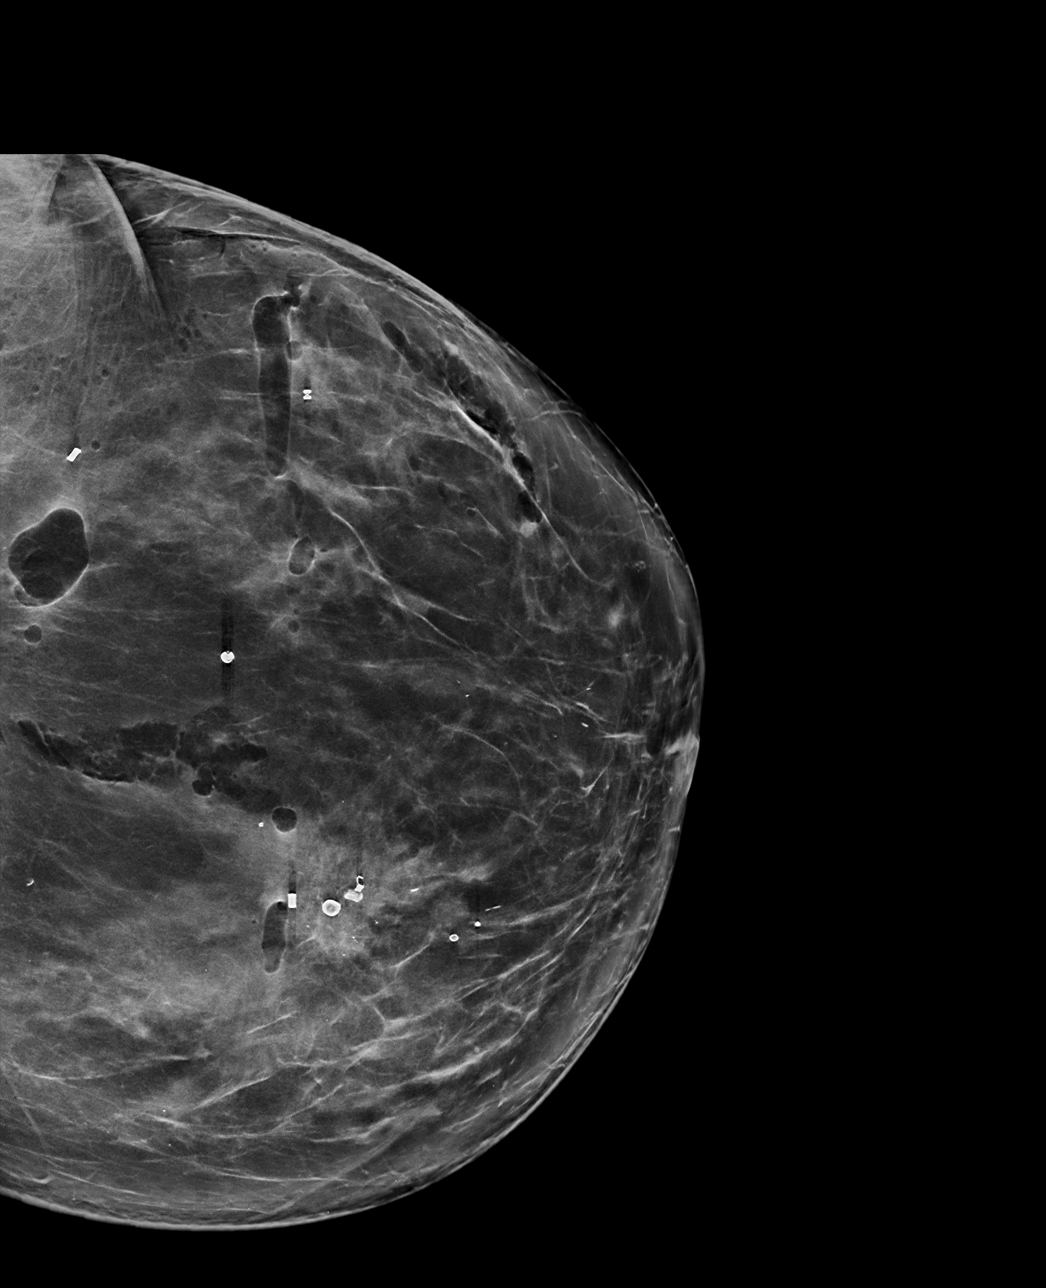

[L CC tomo · tomo slice 48/95.0]
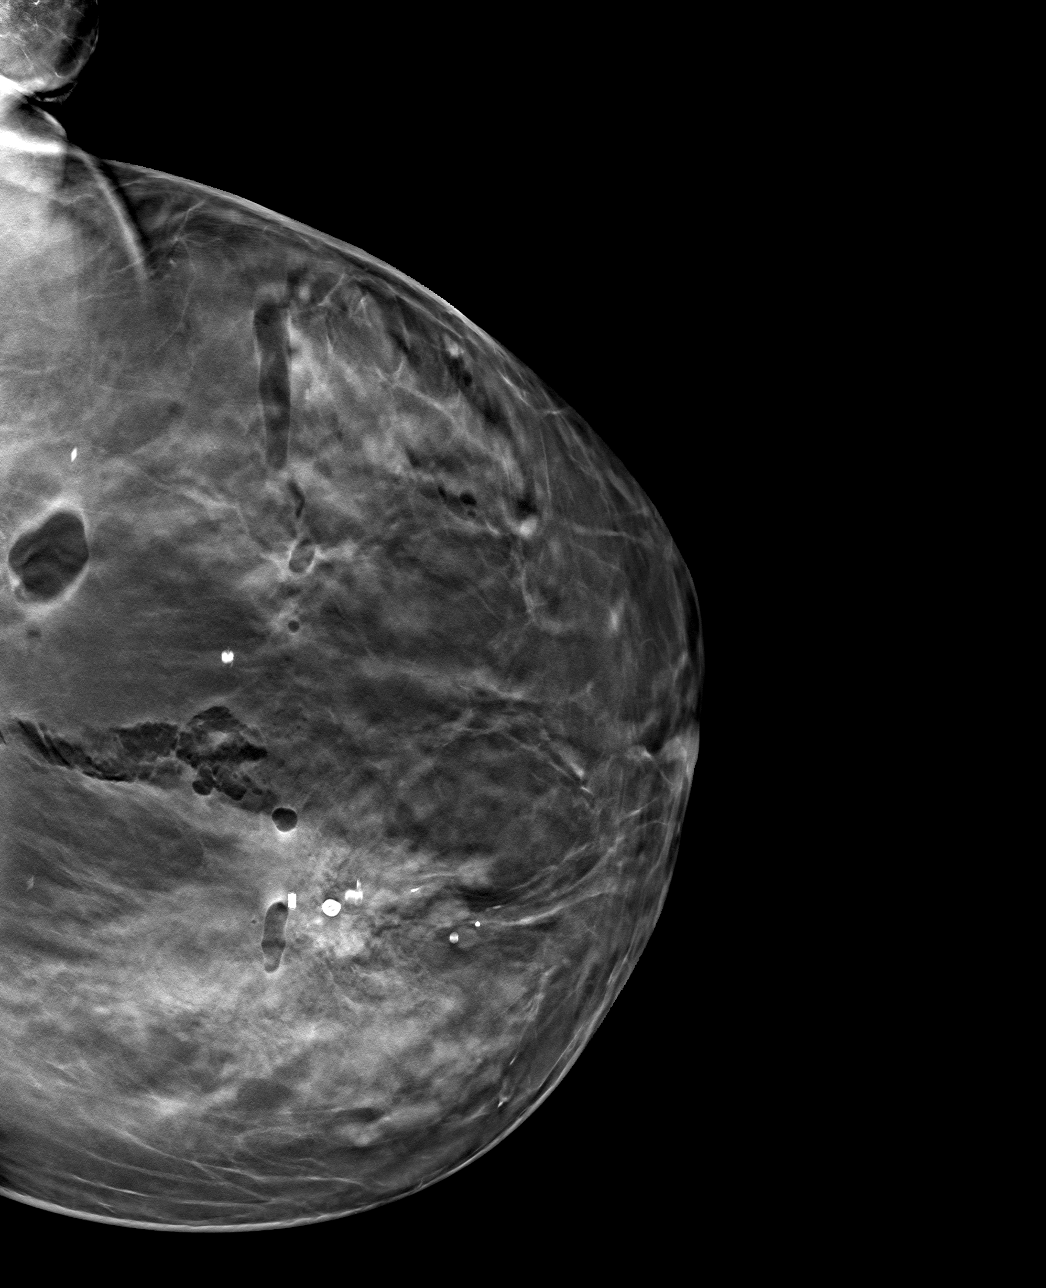

[L ML tomo · tomo slice 47/93.0]
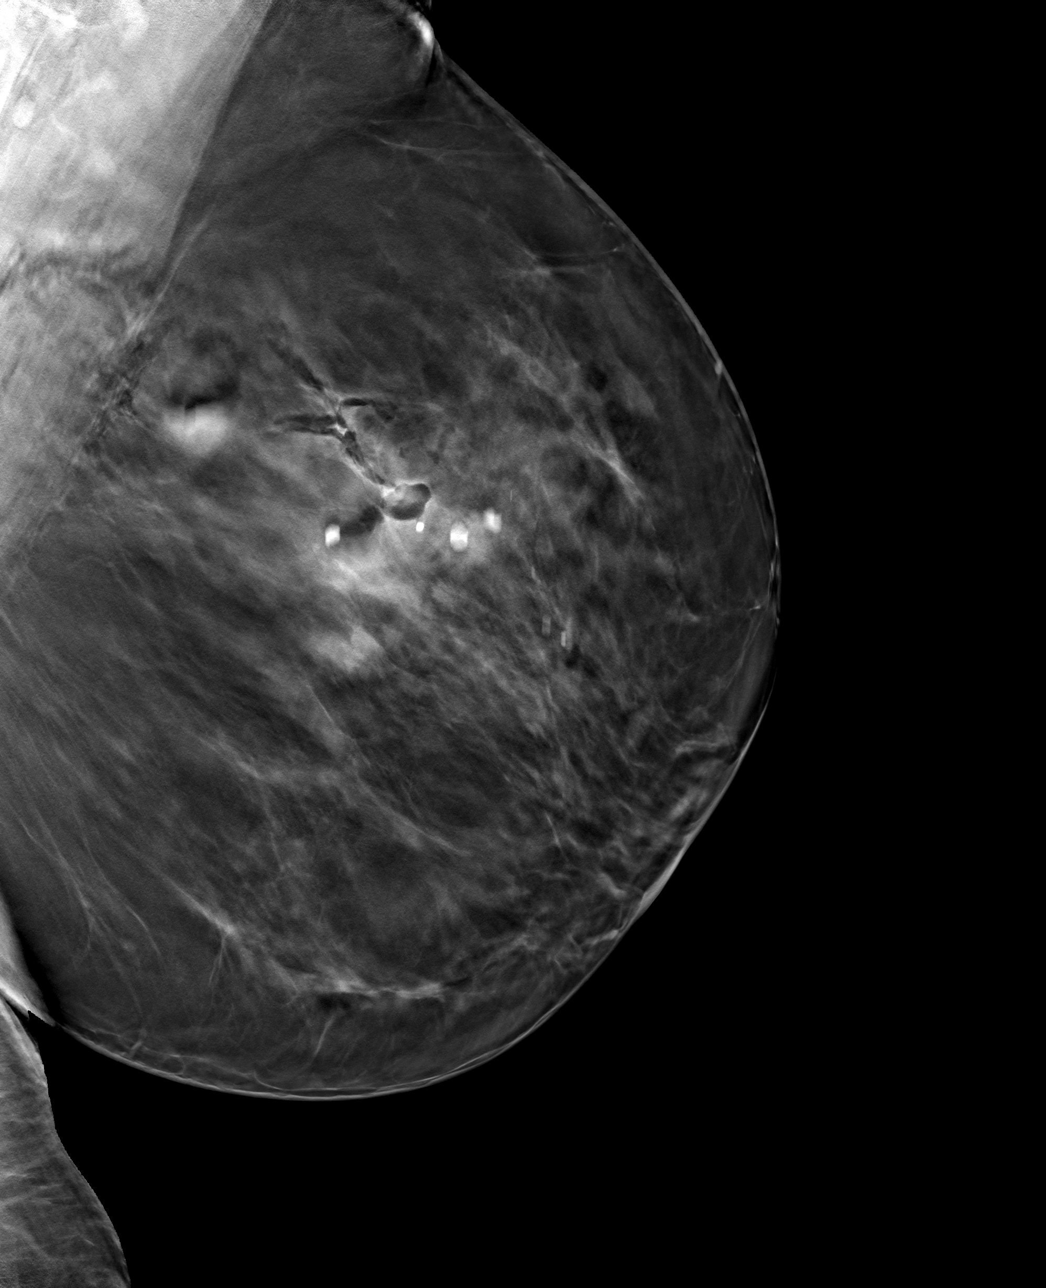

[4 of 12 positions shown; findings below may reference images not displayed]

FINDINGS: 3D Mammographic images were obtained following MRI guided biopsy of
3 sites in the left breast. The biopsy marking clips are in expected
position at the sites of biopsy.
IMPRESSION: 1. Appropriate positioning of the barbell shaped biopsy marking clip
at the site of biopsy in the lateral mid depth of the left breast.

2. Appropriate positioning of the cylinder shaped biopsy marking
clip at the site of biopsy in the lateral posterior depth of the
left breast.

3. Appropriate positioning of the cylinder shaped biopsy marking
clip at the site of biopsy in the upper inner left breast.

Final Assessment: Post Procedure Mammograms for Marker Placement

## 2022-10-07 ENCOUNTER — Inpatient Hospital Stay: Payer: Managed Care, Other (non HMO) | Attending: Hematology and Oncology | Admitting: Hematology and Oncology

## 2022-10-07 DIAGNOSIS — Z79899 Other long term (current) drug therapy: Secondary | ICD-10-CM | POA: Diagnosis not present

## 2022-10-07 DIAGNOSIS — Z86018 Personal history of other benign neoplasm: Secondary | ICD-10-CM | POA: Insufficient documentation

## 2022-10-07 DIAGNOSIS — Z8041 Family history of malignant neoplasm of ovary: Secondary | ICD-10-CM | POA: Diagnosis not present

## 2022-10-07 DIAGNOSIS — Z8541 Personal history of malignant neoplasm of cervix uteri: Secondary | ICD-10-CM | POA: Diagnosis present

## 2022-10-07 DIAGNOSIS — Z880 Allergy status to penicillin: Secondary | ICD-10-CM | POA: Diagnosis not present

## 2022-10-07 DIAGNOSIS — Z1501 Genetic susceptibility to malignant neoplasm of breast: Secondary | ICD-10-CM | POA: Insufficient documentation

## 2022-10-07 DIAGNOSIS — D0512 Intraductal carcinoma in situ of left breast: Secondary | ICD-10-CM

## 2022-10-07 DIAGNOSIS — Z87891 Personal history of nicotine dependence: Secondary | ICD-10-CM | POA: Insufficient documentation

## 2022-10-07 DIAGNOSIS — Z803 Family history of malignant neoplasm of breast: Secondary | ICD-10-CM | POA: Insufficient documentation

## 2022-10-07 DIAGNOSIS — Z8042 Family history of malignant neoplasm of prostate: Secondary | ICD-10-CM | POA: Insufficient documentation

## 2022-10-07 DIAGNOSIS — Z1502 Genetic susceptibility to malignant neoplasm of ovary: Secondary | ICD-10-CM | POA: Insufficient documentation

## 2022-10-07 DIAGNOSIS — Z1509 Genetic susceptibility to other malignant neoplasm: Secondary | ICD-10-CM

## 2022-10-07 DIAGNOSIS — Z13820 Encounter for screening for osteoporosis: Secondary | ICD-10-CM

## 2022-10-07 MED ORDER — ANASTROZOLE 1 MG PO TABS: 1.0000 mg | ORAL_TABLET | Freq: Every day | ORAL | 3 refills | Status: DC

## 2022-10-07 NOTE — Progress Notes (Signed)
[facility] CONSULT NOTE  Patient Care Team: [name], MD as PCP - General (Obstetrics and Gynecology)  CHIEF COMPLAINTS/PURPOSE OF CONSULTATION:  BRCA 2 pathogenic mutation  ASSESSMENT & PLAN:   This is a very pleasant 66 year old female patient with BRCA2 gene mutation, family history significant for breast and ovarian cancer followed by gynecology now referred to high risk breast clinic as well as gynecological oncology.  We had a televisit today to review recommendations for antiestrogen therapy. She is now status post left breast lumpectomy, healing well.  She will be completing radiation soon.  She is wanting to wait until first of New Year's to start antiestrogen therapy if this is reasonable since she has some plans.  We have discussed about options for antiestrogen therapy, I have prescribed her anastrozole to start after radiation.  She wants to return to clinic in about March when she has a plan to visit [place].  She lives about 3 hours from here.    All her questions were answered to the best my knowledge.  She alternates between MRI breast and mammogram for breast cancer screening given her BRCA2 gene mutation. She also has to keep up with her gynecology visits for ovarian cancer screening and annual dermatology visits.  HISTORY OF PRESENTING ILLNESS:   Kathy Christensen 66 y.o. female is here because of BRCA 2 gene mutation.  This is a 66 year old female patient with BRCA2 gene mutation referred to high risk breast clinic for additional recommendations.   Since last visit, she is now undergoing adjuvant radiation, will be finishing it up very soon.  She however wants to wait until the first of next year to start antiestrogen therapy since she has some plans.  We did a video visit today since she lives about 3 hours away.  She denies any other complaints today.  She feels well overall.  MEDICAL HISTORY:  Past Medical History:  Diagnosis Date   BRCA2  gene mutation positive in female    Cervix cancer ([place])    Hypertension    Pituitary macroadenoma ([place])     SURGICAL HISTORY: Past Surgical History:  Procedure Laterality Date   ANKLE SURGERY     BREAST BIOPSY Left 2010   BREAST BIOPSY Left 04/12/2022   LEG SURGERY Right    plate and pins   LIPOMA EXCISION     back of shoulder   ROBOTIC ASSISTED BILATERAL SALPINGO OOPHERECTOMY Bilateral 04/13/2022   Procedure: XI ROBOTIC ASSISTED BILATERAL SALPINGO OOPHORECTOMY, CYSTOSCOPY;  Surgeon: Lafonda Mosses, MD;  [facility];  Service: Gynecology;  Laterality: Bilateral;   VAGINAL HYSTERECTOMY  1997   lsc LND for early cervix cancer    SOCIAL HISTORY: Social History   Socioeconomic History   Marital status: Single    Spouse name: Not on file   Number of children: Not on file   Years of education: Not on file   Highest education level: Not on file  Occupational History   Occupation: works for bank  Tobacco Use   Smoking status: Former    Types: Cigarettes    Quit date: 1993    Years since quitting: 30.8   Smokeless tobacco: Never  Vaping Use   Vaping Use: Never used  Substance and Sexual Activity   Alcohol use: Yes    Alcohol/week: 3.0 standard drinks of alcohol    Types: 3 Standard drinks or equivalent per week   Drug use: Not Currently   Sexual activity: Not Currently  Other  Topics Concern   Not on file  Social History Narrative   Not on file   Social Determinants of Health   Financial Resource Strain: Not on file  Food Insecurity: Not on file  Transportation Needs: Not on file  Physical Activity: Not on file  Stress: Not on file  Social Connections: Not on file  Intimate Partner Violence: Not on file    FAMILY HISTORY: Family History  Problem Relation Age of Onset   Ovarian cancer Mother    Breast cancer Maternal Aunt    Breast cancer Maternal Grandmother    Prostate cancer Paternal Grandfather     ALLERGIES:  is allergic to penicillin  g.  MEDICATIONS:  Current Outpatient Medications  Medication Sig Dispense Refill   Apoaequorin (PREVAGEN) 10 MG CAPS Take 10 mg by mouth 2 (two) times a week.     cholecalciferol (VITAMIN D3) 25 MCG (1000 UNIT) tablet Take 1,000 Units by mouth 2 (two) times a week.     hydrochlorothiazide (HYDRODIURIL) 12.5 MG tablet Take 12.5 mg by mouth daily.     No current facility-administered medications for this visit.     PHYSICAL EXAMINATION: ECOG PERFORMANCE STATUS: 0 - Asymptomatic  There were no vitals filed for this visit.  There were no vitals filed for this visit.  Physical exam deferred, video visit  LABORATORY DATA:  I have reviewed the data as listed Lab Results  Component Value Date   WBC 8.4 08/20/2022   HGB 13.7 08/20/2022   HCT 40.0 08/20/2022   MCV 88.5 08/20/2022   PLT 280 08/20/2022     Chemistry      Component Value Date/Time   NA 138 08/20/2022 1208   K 3.9 08/20/2022 1208   CL 106 08/20/2022 1208   CO2 29 08/20/2022 1208   BUN 11 08/20/2022 1208   CREATININE 0.83 08/20/2022 1208      Component Value Date/Time   CALCIUM 9.8 08/20/2022 1208   ALKPHOS 74 08/20/2022 1208   AST 13 (L) 08/20/2022 1208   ALT 14 08/20/2022 1208   BILITOT 0.4 08/20/2022 1208     I have reviewed pertinent referral records.  I connected with  MALU PELLEGRINI on 10/08/22 by a video enabled telemedicine application and verified that I am speaking with the correct person using two identifiers.   I discussed the limitations of evaluation and management by telemedicine. The patient expressed understanding and agreed to proceed.   RADIOGRAPHIC STUDIES: I have personally reviewed the radiological images as listed and agreed with the findings in the report. No results found.  All questions were answered. The patient knows to call the clinic with any problems, questions or concerns. I spent 15 minutes in the care of this patient including H and P, review of records, counseling and  coordination of care.     Benay Pike, MD 10/07/2022 3:22 PM

## 2023-01-25 ENCOUNTER — Other Ambulatory Visit: Payer: Self-pay

## 2023-01-25 DIAGNOSIS — Z13820 Encounter for screening for osteoporosis: Secondary | ICD-10-CM

## 2023-01-25 DIAGNOSIS — Z1501 Genetic susceptibility to malignant neoplasm of breast: Secondary | ICD-10-CM

## 2023-01-25 NOTE — Progress Notes (Signed)
Pt LVM asking for MM and bone density orders to be faxed to [facility] at [phone].  Diag MM and Bone Density orders changed to external location per Pt request. Orders, demo sheet, and insurance card faxed to [phone] and [phone] with receipt confirmations.  Called Pt and confirmed orders were faxed. Pt verbalized understanding and was appreciative of call.

## 2023-01-27 ENCOUNTER — Other Ambulatory Visit: Payer: Self-pay | Admitting: *Deleted

## 2023-02-02 ENCOUNTER — Other Ambulatory Visit: Payer: Self-pay | Admitting: *Deleted

## 2023-02-02 DIAGNOSIS — Z803 Family history of malignant neoplasm of breast: Secondary | ICD-10-CM

## 2023-02-02 DIAGNOSIS — Z79811 Long term (current) use of aromatase inhibitors: Secondary | ICD-10-CM

## 2023-02-02 DIAGNOSIS — Z1501 Genetic susceptibility to malignant neoplasm of breast: Secondary | ICD-10-CM

## 2023-02-02 DIAGNOSIS — Z13820 Encounter for screening for osteoporosis: Secondary | ICD-10-CM

## 2023-02-02 DIAGNOSIS — Z9189 Other specified personal risk factors, not elsewhere classified: Secondary | ICD-10-CM

## 2023-02-17 ENCOUNTER — Other Ambulatory Visit: Payer: Managed Care, Other (non HMO)

## 2023-03-11 ENCOUNTER — Telehealth: Payer: Self-pay | Admitting: *Deleted

## 2023-03-11 ENCOUNTER — Other Ambulatory Visit: Payer: Self-pay | Admitting: *Deleted

## 2023-03-11 DIAGNOSIS — Z803 Family history of malignant neoplasm of breast: Secondary | ICD-10-CM

## 2023-03-11 DIAGNOSIS — Z9189 Other specified personal risk factors, not elsewhere classified: Secondary | ICD-10-CM

## 2023-03-11 DIAGNOSIS — Z79811 Long term (current) use of aromatase inhibitors: Secondary | ICD-10-CM

## 2023-03-11 DIAGNOSIS — Z1501 Genetic susceptibility to malignant neoplasm of breast: Secondary | ICD-10-CM

## 2023-03-11 DIAGNOSIS — D0512 Intraductal carcinoma in situ of left breast: Secondary | ICD-10-CM

## 2023-03-11 NOTE — Telephone Encounter (Signed)
Kathy Christensen states [facility] cannot get auth for her mammogram and bone density.  Rn called [facility]. They said Medicare is not accepting the diagnosis codes. Orders changed and faxed to [phone]

## 2023-03-22 ENCOUNTER — Other Ambulatory Visit: Payer: Self-pay | Admitting: Obstetrics

## 2023-03-22 DIAGNOSIS — M810 Age-related osteoporosis without current pathological fracture: Secondary | ICD-10-CM

## 2023-04-07 ENCOUNTER — Telehealth: Payer: Self-pay | Admitting: *Deleted

## 2023-04-07 ENCOUNTER — Ambulatory Visit
Admission: RE | Admit: 2023-04-07 | Discharge: 2023-04-07 | Disposition: A | Discharge status: Home / Self Care | Payer: Managed Care, Other (non HMO) | Source: Ambulatory Visit | Attending: Hematology and Oncology | Admitting: Hematology and Oncology

## 2023-04-07 DIAGNOSIS — Z803 Family history of malignant neoplasm of breast: Secondary | ICD-10-CM

## 2023-04-07 DIAGNOSIS — D0512 Intraductal carcinoma in situ of left breast: Secondary | ICD-10-CM

## 2023-04-07 DIAGNOSIS — Z79811 Long term (current) use of aromatase inhibitors: Secondary | ICD-10-CM

## 2023-04-07 DIAGNOSIS — Z9189 Other specified personal risk factors, not elsewhere classified: Secondary | ICD-10-CM

## 2023-04-07 DIAGNOSIS — Z1501 Genetic susceptibility to malignant neoplasm of breast: Secondary | ICD-10-CM

## 2023-04-07 NOTE — Telephone Encounter (Signed)
This RN received VM left by pt stating concern that due to billing issues she was not able to get her follow up mammogram locally in [facility]- so she called and scheduled it at the [facility].  When she came in for her appointment earlier today - they informed her mammogram could not be done due to " do not have prior films ".  She is stating concern due to travel time as well as why films were not requested at time of scheduling.  Noted per this RN's review Larita Fife [place] is approx 3 hours away.  This RN called the [facility] and spoke with Crissie per above - explained concerned and verified issue of not proceeding due lack of  the 1 mammogram she had done elsewhere- with prior done at the [facility] as well pts travel time of over 3 hours 1 way.  Crissie states she will follow up with situation and contact the patient likely tomorrow.  This RN called pt back -  She states when she scheduled she told them of the prior scan done in [facility]. She wasn't asked to bring any films with her.  When she showed she states " they put me in one of those rooms like right before the mammogram and then came back and said they couldn't do it because the radiologist needed the prior film."  She states she informed them of the situation with her travelling 4 hours (due to traffic issues ) 1 way.  This RN apologized to the patient per above and validated her frustration.  This RN offered to follow up as to why the mammogram is not being allowed at the [facility] in [facility].  Pt stated appreciation but due to numerous issues relating to where she lives- and what has happened she feels she needs to transfer her care to a local oncologist so orders are all within and under one practice.  She states she has gone ahead and scheduled an appt locally with an oncologist for early June per leaving the [facility].  Note pt was in her care and had phone on speaker - with some noted interference in  communication.  Plan per discussion is pt will my chart information of who she will be seeing locally so her records here can be forwarded.  Note pt stated appreciation of call and discussion.

## 2023-04-08 ENCOUNTER — Encounter: Payer: Self-pay | Admitting: Hematology and Oncology

## 2023-08-22 ENCOUNTER — Inpatient Hospital Stay: Payer: Managed Care, Other (non HMO) | Attending: Hematology and Oncology | Admitting: Hematology and Oncology

## 2023-08-22 ENCOUNTER — Telehealth: Payer: Self-pay

## 2023-08-22 NOTE — Telephone Encounter (Signed)
Called and LVM for pt regarding appt later today. LVM with call back number [phone].

## 2023-08-22 NOTE — Progress Notes (Deleted)
[facility] CONSULT NOTE  Patient Care Team: Noland Fordyce, MD as PCP - General (Obstetrics and Gynecology)  CHIEF COMPLAINTS/PURPOSE OF CONSULTATION:  BRCA 2 pathogenic mutation  ASSESSMENT & PLAN:   This is a very pleasant 67 year old female patient with BRCA2 gene mutation, family history significant for breast and ovarian cancer followed by gynecology now referred to high risk breast clinic as well as gynecological oncology.  We had a televisit today to review recommendations for antiestrogen therapy. She is now status post left breast lumpectomy, healing well.  She will be completing radiation soon.  She is wanting to wait until first of New Year's to start antiestrogen therapy if this is reasonable since she has some plans.  We have discussed about options for antiestrogen therapy, I have prescribed her anastrozole to start after radiation.  She wants to return to clinic in about March when she has a plan to visit [place].  She lives about 3 hours from here.    All her questions were answered to the best my knowledge.  She alternates between MRI breast and mammogram for breast cancer screening given her BRCA2 gene mutation. She also has to keep up with her gynecology visits for ovarian cancer screening and annual dermatology visits.  HISTORY OF PRESENTING ILLNESS:   Kathy Christensen 67 y.o. female is here because of BRCA 2 gene mutation.  This is a 67 year old female patient with BRCA2 gene mutation referred to high risk breast clinic for additional recommendations.   Since last visit, she is now undergoing adjuvant radiation, will be finishing it up very soon.  She however wants to wait until the first of next year to start antiestrogen therapy since she has some plans.  We did a video visit today since she lives about 3 hours away.  She denies any other complaints today.  She feels well overall.  MEDICAL HISTORY:  Past Medical History:  Diagnosis Date   BRCA2  gene mutation positive in female    Cervix cancer ([facility])    Hypertension    Pituitary macroadenoma ([facility])     SURGICAL HISTORY: Past Surgical History:  Procedure Laterality Date   ANKLE SURGERY     BREAST BIOPSY Left 2010   BREAST BIOPSY Left 04/12/2022   LEG SURGERY Right    plate and pins   LIPOMA EXCISION     back of shoulder   ROBOTIC ASSISTED BILATERAL SALPINGO OOPHERECTOMY Bilateral 04/13/2022   Procedure: XI ROBOTIC ASSISTED BILATERAL SALPINGO OOPHORECTOMY, CYSTOSCOPY;  Surgeon: Carver Fila, MD;  Location: [facility];  Service: Gynecology;  Laterality: Bilateral;   VAGINAL HYSTERECTOMY  1997   lsc LND for early cervix cancer    SOCIAL HISTORY: Social History   Socioeconomic History   Marital status: Single    Spouse name: Not on file   Number of children: Not on file   Years of education: Not on file   Highest education level: Not on file  Occupational History   Occupation: works for bank  Tobacco Use   Smoking status: Former    Current packs/day: 0.00    Types: Cigarettes    Quit date: 1993    Years since quitting: 31.7   Smokeless tobacco: Never  Vaping Use   Vaping status: Never Used  Substance and Sexual Activity   Alcohol use: Yes    Alcohol/week: 3.0 standard drinks of alcohol    Types: 3 Standard drinks or equivalent per week   Drug use: Not Currently  Sexual activity: Not Currently  Other Topics Concern   Not on file  Social History Narrative   Not on file   Social Determinants of Health   Financial Resource Strain: Not on file  Food Insecurity: Not on file  Transportation Needs: Not on file  Physical Activity: Not on file  Stress: Not on file  Social Connections: Unknown (05/20/2022)   Received from [facility], [facility]   Social Connections    Frequency of Communication with Friends and Family: Not asked    Frequency of Social Gatherings with Friends and Family: Not asked  Intimate Partner Violence: Unknown (05/20/2022)    Received from [facility], [facility]   Intimate Partner Violence    Fear of Current or Ex-Partner: Not asked    Emotionally Abused: Not asked    Physically Abused: Not asked    Sexually Abused: Not asked    FAMILY HISTORY: Family History  Problem Relation Age of Onset   Ovarian cancer Mother    Breast cancer Maternal Aunt    Breast cancer Maternal Grandmother    Prostate cancer Paternal Grandfather     ALLERGIES:  is allergic to penicillin g.  MEDICATIONS:  Current Outpatient Medications  Medication Sig Dispense Refill   anastrozole (ARIMIDEX) 1 MG tablet Take 1 tablet (1 mg total) by mouth daily. 90 tablet 3   Apoaequorin (PREVAGEN) 10 MG CAPS Take 10 mg by mouth 2 (two) times a week.     cholecalciferol (VITAMIN D3) 25 MCG (1000 UNIT) tablet Take 1,000 Units by mouth 2 (two) times a week.     hydrochlorothiazide (HYDRODIURIL) 12.5 MG tablet Take 12.5 mg by mouth daily.     No current facility-administered medications for this visit.     PHYSICAL EXAMINATION: ECOG PERFORMANCE STATUS: 0 - Asymptomatic  There were no vitals filed for this visit.  There were no vitals filed for this visit.  Physical exam deferred, video visit  LABORATORY DATA:  I have reviewed the data as listed Lab Results  Component Value Date   WBC 8.4 08/20/2022   HGB 13.7 08/20/2022   HCT 40.0 08/20/2022   MCV 88.5 08/20/2022   PLT 280 08/20/2022     Chemistry      Component Value Date/Time   NA 138 08/20/2022 1208   K 3.9 08/20/2022 1208   CL 106 08/20/2022 1208   CO2 29 08/20/2022 1208   BUN 11 08/20/2022 1208   CREATININE 0.83 08/20/2022 1208      Component Value Date/Time   CALCIUM 9.8 08/20/2022 1208   ALKPHOS 74 08/20/2022 1208   AST 13 (L) 08/20/2022 1208   ALT 14 08/20/2022 1208   BILITOT 0.4 08/20/2022 1208     I have reviewed pertinent referral records.   RADIOGRAPHIC STUDIES: I have personally reviewed the radiological images as listed and agreed with the  findings in the report. No results found.  All questions were answered. The patient knows to call the clinic with any problems, questions or concerns. I spent 15 minutes in the care of this patient including H and P, review of records, counseling and coordination of care.     Rachel Moulds, MD 08/22/2023 1:07 PM

## 2023-08-29 ENCOUNTER — Telehealth: Payer: Self-pay | Admitting: Hematology and Oncology

## 2023-08-29 NOTE — Telephone Encounter (Signed)
Per IB Message on 08/22/23; I called patient and left a voice mail with details of her rescheduled appointment. I also mailed a reminder notice.

## 2023-09-06 ENCOUNTER — Ambulatory Visit: Payer: Managed Care, Other (non HMO) | Admitting: Hematology and Oncology

## 2023-09-29 ENCOUNTER — Telehealth: Payer: Self-pay | Admitting: Hematology and Oncology

## 2023-09-29 NOTE — Telephone Encounter (Signed)
Spoke with patient confirming upcoming appointment  

## 2023-10-17 ENCOUNTER — Ambulatory Visit: Payer: Managed Care, Other (non HMO) | Admitting: Hematology and Oncology

## 2023-10-17 ENCOUNTER — Inpatient Hospital Stay: Payer: Managed Care, Other (non HMO) | Attending: Hematology and Oncology | Admitting: Hematology and Oncology

## 2023-12-01 ENCOUNTER — Inpatient Hospital Stay: Payer: Managed Care, Other (non HMO) | Attending: Hematology and Oncology | Admitting: Hematology and Oncology

## 2023-12-01 VITALS — BP 118/70 | HR 75 | Temp 98.3°F | Resp 16 | Wt 222.2 lb

## 2023-12-01 DIAGNOSIS — Z79899 Other long term (current) drug therapy: Secondary | ICD-10-CM | POA: Diagnosis not present

## 2023-12-01 DIAGNOSIS — Z87891 Personal history of nicotine dependence: Secondary | ICD-10-CM | POA: Diagnosis not present

## 2023-12-01 DIAGNOSIS — Z9181 History of falling: Secondary | ICD-10-CM | POA: Insufficient documentation

## 2023-12-01 DIAGNOSIS — Z803 Family history of malignant neoplasm of breast: Secondary | ICD-10-CM | POA: Diagnosis not present

## 2023-12-01 DIAGNOSIS — Z8541 Personal history of malignant neoplasm of cervix uteri: Secondary | ICD-10-CM | POA: Insufficient documentation

## 2023-12-01 DIAGNOSIS — Z90710 Acquired absence of both cervix and uterus: Secondary | ICD-10-CM | POA: Diagnosis not present

## 2023-12-01 DIAGNOSIS — M810 Age-related osteoporosis without current pathological fracture: Secondary | ICD-10-CM | POA: Diagnosis not present

## 2023-12-01 DIAGNOSIS — Z79811 Long term (current) use of aromatase inhibitors: Secondary | ICD-10-CM | POA: Insufficient documentation

## 2023-12-01 DIAGNOSIS — D0512 Intraductal carcinoma in situ of left breast: Secondary | ICD-10-CM

## 2023-12-01 DIAGNOSIS — Z1501 Genetic susceptibility to malignant neoplasm of breast: Secondary | ICD-10-CM | POA: Diagnosis not present

## 2023-12-01 DIAGNOSIS — Z1502 Genetic susceptibility to malignant neoplasm of ovary: Secondary | ICD-10-CM

## 2023-12-01 DIAGNOSIS — Z853 Personal history of malignant neoplasm of breast: Secondary | ICD-10-CM | POA: Diagnosis not present

## 2023-12-01 DIAGNOSIS — Z8042 Family history of malignant neoplasm of prostate: Secondary | ICD-10-CM | POA: Diagnosis not present

## 2023-12-01 DIAGNOSIS — Z86018 Personal history of other benign neoplasm: Secondary | ICD-10-CM | POA: Insufficient documentation

## 2023-12-01 DIAGNOSIS — Z880 Allergy status to penicillin: Secondary | ICD-10-CM | POA: Diagnosis not present

## 2023-12-01 DIAGNOSIS — Z1509 Genetic susceptibility to other malignant neoplasm: Secondary | ICD-10-CM | POA: Diagnosis not present

## 2023-12-01 DIAGNOSIS — Z8041 Family history of malignant neoplasm of ovary: Secondary | ICD-10-CM | POA: Diagnosis not present

## 2023-12-01 MED ORDER — EXEMESTANE 25 MG PO TABS: 25.0000 mg | ORAL_TABLET | Freq: Every day | ORAL | 3 refills | Status: DC

## 2023-12-01 NOTE — Progress Notes (Signed)
 [facility] CONSULT NOTE  Patient Care Team: Kandyce Sor, MD as PCP - General (Obstetrics and Gynecology)  CHIEF COMPLAINTS/PURPOSE OF CONSULTATION:  BRCA 2 pathogenic mutation  ASSESSMENT & PLAN:   This is a very pleasant 68 year old female patient with BRCA2 gene mutation, family history significant for breast and ovarian cancer followed by gynecology now referred to high risk breast clinic as well as gynecological oncology.  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 -Discontinue Anastrozole . -Start Exemestane  (Aromasin ) daily. -Encouraged patient to report if unable to tolerate new medication.  Osteoporosis Recent DEXA scan shows osteoporosis. Patient was unaware of diagnosis. -Refer to primary care provider (Dr. Sor File) for osteoporosis management.  Rib Fracture Old rib fracture noted on recent MRI. Patient reports no recent trauma but did have a fall within the last five years.  Offered further evaluation with CT, patient reluctant -No further imaging or intervention planned at this time.  General Health Maintenance -Continue annual mammograms in June and MRIs in December given BRCA2 mutation.  No new family history with pancreatic cancer.  No abnormal skin lesions reported.. -Next mammogram due in June 2025, MRI due in December 2025, both of these have been ordered and will be faxed to [facility] in [place] per patient's request  HISTORY OF PRESENTING ILLNESS:   Kathy Christensen 68 y.o. female is here because of BRCA 2 gene mutation.  This is a 68 year old female patient with BRCA2 gene mutation now on anastrozole  who is here for a follow up.  Discussed the use of AI scribe software for clinical note transcription with the patient, who gave verbal consent to proceed.  History of Present Illness    The patient, with a history of osteoporosis and breast cancer, presents  with concerns about her current medication regimen. She reports non-compliance with Anastrozole  due to side effects including insomnia, hot flashes, and bone aches. She describes these aches as a deep, pervasive discomfort in her legs. She also reports a recent DEXA scan, which revealed osteoporosis. However, she has not been prescribed any treatment for this condition. She also mentions a recent breast MRI and mammogram, but does not report any issues or concerns related to these tests.  MEDICAL HISTORY:  Past Medical History:  Diagnosis Date   BRCA2 gene mutation positive in female    Cervix cancer (HCC)    Hypertension    Pituitary macroadenoma (HCC)     SURGICAL HISTORY: Past Surgical History:  Procedure Laterality Date   ANKLE SURGERY     BREAST BIOPSY Left 2010   BREAST BIOPSY Left 04/12/2022   LEG SURGERY Right    plate and pins   LIPOMA EXCISION     back of shoulder   ROBOTIC ASSISTED BILATERAL SALPINGO OOPHERECTOMY Bilateral 04/13/2022   Procedure: XI ROBOTIC ASSISTED BILATERAL SALPINGO OOPHORECTOMY, CYSTOSCOPY;  Surgeon: Viktoria Comer SAUNDERS, MD;  Location: [facility];  Service: Gynecology;  Laterality: Bilateral;   VAGINAL HYSTERECTOMY  1997   lsc LND for early cervix cancer    SOCIAL HISTORY: Social History   Socioeconomic History   Marital status: Single    Spouse name: Not on file   Number of children: Not on file   Years of education: Not on file   Highest education level: Not on file  Occupational History   Occupation: works for bank  Tobacco Use   Smoking status: Former    Current packs/day: 0.00    Types:  Cigarettes    Quit date: 3    Years since quitting: 32.0   Smokeless tobacco: Never  Vaping Use   Vaping status: Never Used  Substance and Sexual Activity   Alcohol use: Yes    Alcohol/week: 3.0 standard drinks of alcohol    Types: 3 Standard drinks or equivalent per week   Drug use: Not Currently   Sexual activity: Not Currently  Other Topics  Concern   Not on file  Social History Narrative   Not on file   Social Drivers of Health   Financial Resource Strain: Not on file  Food Insecurity: Not on file  Transportation Needs: Not on file  Physical Activity: Not on file  Stress: Not on file  Social Connections: Unknown (05/20/2022)   Received from [facility], [facility]   Social Connections    Frequency of Communication with Friends and Family: Not asked    Frequency of Social Gatherings with Friends and Family: Not asked  Intimate Partner Violence: Unknown (05/20/2022)   Received from [facility], [facility]   Intimate Partner Violence    Fear of Current or Ex-Partner: Not asked    Emotionally Abused: Not asked    Physically Abused: Not asked    Sexually Abused: Not asked    FAMILY HISTORY: Family History  Problem Relation Age of Onset   Ovarian cancer Mother    Breast cancer Maternal Aunt    Breast cancer Maternal Grandmother    Prostate cancer Paternal Grandfather     ALLERGIES:  is allergic to penicillin g.  MEDICATIONS:  Current Outpatient Medications  Medication Sig Dispense Refill   anastrozole  (ARIMIDEX ) 1 MG tablet Take 1 tablet (1 mg total) by mouth daily. 90 tablet 3   Apoaequorin (PREVAGEN) 10 MG CAPS Take 10 mg by mouth 2 (two) times a week.     cholecalciferol (VITAMIN D3) 25 MCG (1000 UNIT) tablet Take 1,000 Units by mouth 2 (two) times a week.     hydrochlorothiazide (HYDRODIURIL) 12.5 MG tablet Take 12.5 mg by mouth daily.     No current facility-administered medications for this visit.     PHYSICAL EXAMINATION: ECOG PERFORMANCE STATUS: 0 - Asymptomatic  Vitals:   12/01/23 1200  BP: 118/70  Pulse: 75  Resp: 16  Temp: 98.3 F (36.8 C)  SpO2: 98%    Filed Weights   12/01/23 1200  Weight: 222 lb 3.2 oz (100.8 kg)    General appearance: Alert oriented and in no acute distress Bilateral breast inspected and palpated, no palpable masses or regional adenopathy. No lower  extremity edema Chest clear to auscultation bilaterally  LABORATORY DATA:  I have reviewed the data as listed Lab Results  Component Value Date   WBC 8.4 08/20/2022   HGB 13.7 08/20/2022   HCT 40.0 08/20/2022   MCV 88.5 08/20/2022   PLT 280 08/20/2022     Chemistry      Component Value Date/Time   NA 138 08/20/2022 1208   K 3.9 08/20/2022 1208   CL 106 08/20/2022 1208   CO2 29 08/20/2022 1208   BUN 11 08/20/2022 1208   CREATININE 0.83 08/20/2022 1208      Component Value Date/Time   CALCIUM 9.8 08/20/2022 1208   ALKPHOS 74 08/20/2022 1208   AST 13 (L) 08/20/2022 1208   ALT 14 08/20/2022 1208   BILITOT 0.4 08/20/2022 1208     I have reviewed pertinent referral records.  I connected with  Devere FORBES Parents on 12/01/23 by  a video enabled telemedicine application and verified that I am speaking with the correct person using two identifiers.   I discussed the limitations of evaluation and management by telemedicine. The patient expressed understanding and agreed to proceed.   RADIOGRAPHIC STUDIES: I have personally reviewed the radiological images as listed and agreed with the findings in the report. No results found.  All questions were answered. The patient knows to call the clinic with any problems, questions or concerns.      Amber Stalls, MD 12/01/2023 12:10 PM

## 2024-11-05 ENCOUNTER — Telehealth: Payer: Self-pay | Admitting: Hematology and Oncology

## 2024-11-05 NOTE — Telephone Encounter (Signed)
 Left pt a voicemail regarding rescheduled 12/03/24 appt, changed to 12/06/24. Patient was informed of new appt date and time.

## 2024-11-20 ENCOUNTER — Other Ambulatory Visit: Payer: Self-pay | Admitting: Hematology and Oncology

## 2024-11-26 ENCOUNTER — Telehealth: Payer: Self-pay | Admitting: Hematology and Oncology

## 2024-11-26 NOTE — Telephone Encounter (Signed)
 I spoke with patient as she called in to reschedule MD appointment from 12/07/2023 to 12/19/2024.

## 2024-12-03 ENCOUNTER — Ambulatory Visit: Payer: Managed Care, Other (non HMO) | Admitting: Hematology and Oncology

## 2024-12-06 ENCOUNTER — Inpatient Hospital Stay: Admitting: Hematology and Oncology

## 2024-12-19 ENCOUNTER — Telehealth: Payer: Self-pay | Admitting: *Deleted

## 2024-12-19 ENCOUNTER — Inpatient Hospital Stay: Attending: Hematology and Oncology | Admitting: Hematology and Oncology

## 2024-12-19 VITALS — BP 124/79 | HR 75 | Temp 98.1°F | Resp 18 | Ht 64.0 in | Wt 209.8 lb

## 2024-12-19 DIAGNOSIS — Z1589 Genetic susceptibility to other disease: Secondary | ICD-10-CM

## 2024-12-19 DIAGNOSIS — Z1505 Genetic susceptibility to malignant neoplasm of fallopian tube(s): Secondary | ICD-10-CM

## 2024-12-19 DIAGNOSIS — M818 Other osteoporosis without current pathological fracture: Secondary | ICD-10-CM | POA: Insufficient documentation

## 2024-12-19 DIAGNOSIS — Z803 Family history of malignant neoplasm of breast: Secondary | ICD-10-CM | POA: Insufficient documentation

## 2024-12-19 DIAGNOSIS — Z1509 Genetic susceptibility to other malignant neoplasm: Secondary | ICD-10-CM

## 2024-12-19 DIAGNOSIS — Z880 Allergy status to penicillin: Secondary | ICD-10-CM | POA: Insufficient documentation

## 2024-12-19 DIAGNOSIS — Z8041 Family history of malignant neoplasm of ovary: Secondary | ICD-10-CM | POA: Insufficient documentation

## 2024-12-19 DIAGNOSIS — Z1501 Genetic susceptibility to malignant neoplasm of breast: Secondary | ICD-10-CM | POA: Diagnosis not present

## 2024-12-19 DIAGNOSIS — Z79811 Long term (current) use of aromatase inhibitors: Secondary | ICD-10-CM | POA: Insufficient documentation

## 2024-12-19 DIAGNOSIS — Z90710 Acquired absence of both cervix and uterus: Secondary | ICD-10-CM | POA: Insufficient documentation

## 2024-12-19 DIAGNOSIS — Z86018 Personal history of other benign neoplasm: Secondary | ICD-10-CM | POA: Insufficient documentation

## 2024-12-19 DIAGNOSIS — Z1502 Genetic susceptibility to malignant neoplasm of ovary: Secondary | ICD-10-CM | POA: Diagnosis not present

## 2024-12-19 DIAGNOSIS — D0512 Intraductal carcinoma in situ of left breast: Secondary | ICD-10-CM | POA: Diagnosis not present

## 2024-12-19 DIAGNOSIS — Z8541 Personal history of malignant neoplasm of cervix uteri: Secondary | ICD-10-CM | POA: Insufficient documentation

## 2024-12-19 DIAGNOSIS — L989 Disorder of the skin and subcutaneous tissue, unspecified: Secondary | ICD-10-CM | POA: Insufficient documentation

## 2024-12-19 DIAGNOSIS — Z15068 Genetic susceptibility to other malignant neoplasm of digestive system: Secondary | ICD-10-CM

## 2024-12-19 DIAGNOSIS — Z8042 Family history of malignant neoplasm of prostate: Secondary | ICD-10-CM | POA: Insufficient documentation

## 2024-12-19 DIAGNOSIS — Z79899 Other long term (current) drug therapy: Secondary | ICD-10-CM | POA: Insufficient documentation

## 2024-12-19 DIAGNOSIS — Z1507 Genetic susceptibility to malignant neoplasm of urinary tract: Secondary | ICD-10-CM | POA: Diagnosis not present

## 2024-12-19 DIAGNOSIS — Z87891 Personal history of nicotine dependence: Secondary | ICD-10-CM | POA: Insufficient documentation

## 2024-12-19 NOTE — Telephone Encounter (Signed)
 Faxed order for Mammogram to [facility], [place], [place] Fax confirmation received

## 2024-12-19 NOTE — Progress Notes (Signed)
 [facility] CONSULT NOTE  Patient Care Team: Kandyce Sor, MD as PCP - General (Obstetrics and Gynecology)  CHIEF COMPLAINTS/PURPOSE OF CONSULTATION:  BRCA 2 pathogenic mutation  ASSESSMENT & PLAN:   This is a very pleasant 69 year old female patient with BRCA2 gene mutation, family history significant for breast and ovarian cancer followed by gynecology now referred to high risk breast clinic as well as gynecological oncology.  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 Surveillance includes alternating mammogram and breast MRI.  She is overdue for mammogram and MRI. - Faxed mammogram and MRI order to [facility] for surveillance imaging. - Instructed her to complete mammogram prior to MRI, as MRI requires a recent mammogram. - Advised dermatology evaluation and possible excision of new painful skin lesion; if melanoma is diagnosed, further imaging will be pursued. - Continue exemestane  for a total of five years (until November 2028). - Discussed future transition of oncology care to [facility] or another location as she approaches age 65.  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 Aware of need for bisphosphonate therapy, pending dental clearance. - Discussed bisphosphonate therapy options (every six months at current center vs. once yearly at other centers or with primary care). 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 Considering fezolinetant (Veozah) pending insurance coverage and affordability. Motivated to improve sleep and quality of life. - Encouraged trial of fezolinetant (Veozah) if insurance covers and cost is manageable; discussed  potential availability of generic. - Supported ongoing lifestyle modifications, including reduced alcohol intake, to manage symptoms.  HISTORY OF PRESENTING ILLNESS:   Kathy Christensen 69 y.o. female is here because of BRCA 2 gene mutation.  This is a 69 year old female patient with BRCA2 gene mutation now on anastrozole  who is here for a follow up.  Discussed the use of AI scribe software for clinical note transcription with the patient, who gave verbal consent to proceed.  History of Present Illness Kathy Christensen is a 69 year old female with ductal carcinoma in situ of the left breast (BRCA2 mutation) who presents for oncology follow-up to address ongoing endocrine therapy side effects.  She is in the second year of a planned five-year course of adjuvant exemestane , taken once daily. She alternates dosing between breakfast and bedtime, with improved tolerability at night. She experiences significant vasomotor symptoms, including severe hot flashes and persistent sleep disruption, which she attributes to exemestane . She has reduced alcohol intake to two drinks per month, noting exacerbation of hot flashes with alcohol. She is considering fezolinetant for symptom management, as recommended by her gynecologist, but has not initiated therapy due to cost and pharmacy availability. She is nurse, mental health and FSA coverage, prioritizing improved sleep for work and quality of life.  She is not up to date with breast cancer surveillance, alternating mammogram and MRI. Her last MRI was in December 2024. She plans to obtain the mammogram at [facility] prior to her next MRI, per protocol.  She has developed a new, painful skin lesion on the right elbow described as a very tender spot. Given her BRCA2 mutation and increased risk for melanoma, she is concerned about malignancy and plans dermatologic evaluation with possible excision and pathologic assessment.  She has a history of osteoporosis based on prior  bone density scan and  is aware of the contribution of aromatase inhibitor therapy to bone loss. She has not started pharmacologic therapy. She is considering management options, including treatment through her primary care provider to minimize travel burden.  She discussed care coordination challenges due to management by multiple providers and is considering transitioning oncology care to a location closer to home after completing endocrine therapy at age 48. MEDICAL HISTORY:  Past Medical History:  Diagnosis Date   BRCA2 gene mutation positive in female    Cervix cancer (HCC)    Hypertension    Pituitary macroadenoma (HCC)     SURGICAL HISTORY: Past Surgical History:  Procedure Laterality Date   ANKLE SURGERY     BREAST BIOPSY Left 2010   BREAST BIOPSY Left 04/12/2022   LEG SURGERY Right    plate and pins   LIPOMA EXCISION     back of shoulder   ROBOTIC ASSISTED BILATERAL SALPINGO OOPHERECTOMY Bilateral 04/13/2022   Procedure: XI ROBOTIC ASSISTED BILATERAL SALPINGO OOPHORECTOMY, CYSTOSCOPY;  Surgeon: Viktoria Comer SAUNDERS, MD;  Location: [facility];  Service: Gynecology;  Laterality: Bilateral;   VAGINAL HYSTERECTOMY  1997   lsc LND for early cervix cancer    SOCIAL HISTORY: Social History   Socioeconomic History   Marital status: Single    Spouse name: Not on file   Number of children: Not on file   Years of education: Not on file   Highest education level: Not on file  Occupational History   Occupation: works for bank  Tobacco Use   Smoking status: Former    Current packs/day: 0.00    Types: Cigarettes    Quit date: 1993    Years since quitting: 33.0   Smokeless tobacco: Never  Vaping Use   Vaping status: Never Used  Substance and Sexual Activity   Alcohol use: Yes    Alcohol/week: 3.0 standard drinks of alcohol    Types: 3 Standard drinks or equivalent per week   Drug use: Not Currently   Sexual activity: Not Currently  Other Topics Concern   Not on file  Social  History Narrative   Not on file   Social Drivers of Health   Tobacco Use: Medium Risk (05/13/2023)   Received from [facility]   Patient History    Smoking Tobacco Use: Former    Smokeless Tobacco Use: Never    Passive Exposure: Not on Actuary Strain: Not on file  Food Insecurity: Not on file  Transportation Needs: Not on file  Physical Activity: Not on file  Stress: Not on file  Social Connections: Unknown (05/20/2022)   Received from [facility]   Social Connections    Frequency of Communication with Friends and Family: Not asked    Frequency of Social Gatherings with Friends and Family: Not asked  Intimate Partner Violence: Unknown (05/20/2022)   Received from [facility]   Intimate Partner Violence    Fear of Current or Ex-Partner: Not asked    Emotionally Abused: Not asked    Physically Abused: Not asked    Sexually Abused: Not asked  Depression (PHQ2-9): Not on file  Alcohol Screen: Not on file  Housing: Not on file  Utilities: Not on file  Health Literacy: Not on file    FAMILY HISTORY: Family History  Problem Relation Age of Onset   Ovarian cancer Mother    Breast cancer Maternal Aunt    Breast cancer Maternal Grandmother    Prostate cancer Paternal Grandfather     ALLERGIES:  is allergic to penicillin g.  MEDICATIONS:  Current Outpatient Medications  Medication Sig Dispense Refill   Apoaequorin (PREVAGEN) 10 MG CAPS Take 10 mg by mouth 2 (two) times a week.     cholecalciferol (VITAMIN D3) 25 MCG (1000 UNIT) tablet Take 1,000 Units by mouth 2 (two) times a week.     exemestane  (AROMASIN ) 25 MG tablet TAKE 1 TABLET (25 MG TOTAL) BY MOUTH DAILY AFTER BREAKFAST. 90 tablet 3   hydrochlorothiazide (HYDRODIURIL) 12.5 MG tablet Take 12.5 mg by mouth daily.     No current facility-administered medications for this visit.     PHYSICAL EXAMINATION: ECOG PERFORMANCE STATUS: 0 - Asymptomatic  Vitals:   12/19/24 1429  BP: 124/79  Pulse:  75  Resp: 18  Temp: 98.1 F (36.7 C)  SpO2: 100%     Filed Weights   12/19/24 1429  Weight: 209 lb 12.8 oz (95.2 kg)     General appearance: Alert oriented and in no acute distress Bilateral breast inspected and palpated, no palpable masses or regional adenopathy. No lower extremity edema Chest clear to auscultation bilaterally  LABORATORY DATA:  I have reviewed the data as listed Lab Results  Component Value Date   WBC 8.4 08/20/2022   HGB 13.7 08/20/2022   HCT 40.0 08/20/2022   MCV 88.5 08/20/2022   PLT 280 08/20/2022     Chemistry      Component Value Date/Time   NA 138 08/20/2022 1208   K 3.9 08/20/2022 1208   CL 106 08/20/2022 1208   CO2 29 08/20/2022 1208   BUN 11 08/20/2022 1208   CREATININE 0.83 08/20/2022 1208      Component Value Date/Time   CALCIUM 9.8 08/20/2022 1208   ALKPHOS 74 08/20/2022 1208   AST 13 (L) 08/20/2022 1208   ALT 14 08/20/2022 1208   BILITOT 0.4 08/20/2022 1208        RADIOGRAPHIC STUDIES: I have personally reviewed the radiological images as listed and agreed with the findings in the report. No results found.  All questions were answered. The patient knows to call the clinic with any problems, questions or concerns.      Amber Stalls, MD 12/19/2024 9:29 PM

## 2025-12-20 ENCOUNTER — Inpatient Hospital Stay: Admitting: Hematology and Oncology
